# Patient Record
Sex: Male | Born: 1940 | Race: Black or African American | Hispanic: No | Smoking: Never smoker
Health system: Southern US, Community
[De-identification: ages and names within clinical notes are randomized; demographics above are authoritative.]

## PROBLEM LIST (undated history)

## (undated) DIAGNOSIS — I10 Essential (primary) hypertension: Secondary | ICD-10-CM

## (undated) DIAGNOSIS — E119 Type 2 diabetes mellitus without complications: Secondary | ICD-10-CM

## (undated) DIAGNOSIS — E7800 Pure hypercholesterolemia, unspecified: Secondary | ICD-10-CM

## (undated) HISTORY — PX: KNEE SURGERY: SHX244

---

## 2006-04-10 ENCOUNTER — Emergency Department (HOSPITAL_COMMUNITY): Admission: EM | Admit: 2006-04-10 | Discharge: 2006-04-10 | Payer: Self-pay | Admitting: Emergency Medicine

## 2009-01-06 ENCOUNTER — Emergency Department (HOSPITAL_COMMUNITY): Admission: EM | Admit: 2009-01-06 | Discharge: 2009-01-06 | Payer: Self-pay | Admitting: Emergency Medicine

## 2011-04-20 ENCOUNTER — Ambulatory Visit (INDEPENDENT_AMBULATORY_CARE_PROVIDER_SITE_OTHER): Payer: BC Managed Care – PPO

## 2011-04-20 ENCOUNTER — Inpatient Hospital Stay (INDEPENDENT_AMBULATORY_CARE_PROVIDER_SITE_OTHER)
Admission: RE | Admit: 2011-04-20 | Discharge: 2011-04-20 | Disposition: A | Discharge status: Home / Self Care | Payer: BC Managed Care – PPO | Source: Ambulatory Visit | Attending: Emergency Medicine | Admitting: Emergency Medicine

## 2011-04-20 ENCOUNTER — Emergency Department (HOSPITAL_COMMUNITY)
Admission: EM | Admit: 2011-04-20 | Discharge: 2011-04-21 | Disposition: A | Discharge status: Home / Self Care | Payer: Non-veteran care | Attending: Emergency Medicine | Admitting: Emergency Medicine

## 2011-04-20 DIAGNOSIS — I498 Other specified cardiac arrhythmias: Secondary | ICD-10-CM | POA: Insufficient documentation

## 2011-04-20 DIAGNOSIS — E785 Hyperlipidemia, unspecified: Secondary | ICD-10-CM | POA: Insufficient documentation

## 2011-04-20 DIAGNOSIS — R0989 Other specified symptoms and signs involving the circulatory and respiratory systems: Secondary | ICD-10-CM

## 2011-04-20 DIAGNOSIS — R0602 Shortness of breath: Secondary | ICD-10-CM | POA: Insufficient documentation

## 2011-04-20 DIAGNOSIS — E1169 Type 2 diabetes mellitus with other specified complication: Secondary | ICD-10-CM | POA: Insufficient documentation

## 2011-04-20 DIAGNOSIS — R0609 Other forms of dyspnea: Secondary | ICD-10-CM

## 2011-04-20 DIAGNOSIS — Z8679 Personal history of other diseases of the circulatory system: Secondary | ICD-10-CM | POA: Insufficient documentation

## 2011-04-20 DIAGNOSIS — J9801 Acute bronchospasm: Secondary | ICD-10-CM | POA: Insufficient documentation

## 2011-04-20 DIAGNOSIS — R05 Cough: Secondary | ICD-10-CM | POA: Insufficient documentation

## 2011-04-20 DIAGNOSIS — R059 Cough, unspecified: Secondary | ICD-10-CM | POA: Insufficient documentation

## 2011-04-20 DIAGNOSIS — I252 Old myocardial infarction: Secondary | ICD-10-CM | POA: Insufficient documentation

## 2011-04-20 DIAGNOSIS — I10 Essential (primary) hypertension: Secondary | ICD-10-CM | POA: Insufficient documentation

## 2011-04-20 LAB — DIFFERENTIAL
Basophils Absolute: 0 10*3/uL (ref 0.0–0.1)
Basophils Relative: 0 % (ref 0–1)
Eosinophils Relative: 1 % (ref 0–5)
Neutro Abs: 5.5 10*3/uL (ref 1.7–7.7)
Neutrophils Relative %: 61 % (ref 43–77)

## 2011-04-20 LAB — CBC
HCT: 44.1 % (ref 39.0–52.0)
Hemoglobin: 16.1 g/dL (ref 13.0–17.0)
MCHC: 36.5 g/dL — ABNORMAL HIGH (ref 30.0–36.0)

## 2011-04-20 LAB — BASIC METABOLIC PANEL
BUN: 14 mg/dL (ref 6–23)
CO2: 23 mEq/L (ref 19–32)
Calcium: 9.3 mg/dL (ref 8.4–10.5)
Creatinine, Ser: 1.52 mg/dL — ABNORMAL HIGH (ref 0.50–1.35)
Glucose, Bld: 137 mg/dL — ABNORMAL HIGH (ref 70–99)

## 2011-04-21 ENCOUNTER — Emergency Department (HOSPITAL_COMMUNITY): Payer: Non-veteran care

## 2011-04-21 LAB — CK TOTAL AND CKMB (NOT AT ARMC)
CK, MB: 2.7 ng/mL (ref 0.3–4.0)
CK, MB: 4.3 ng/mL — ABNORMAL HIGH (ref 0.3–4.0)
Relative Index: 0.8 (ref 0.0–2.5)
Relative Index: 1.3 (ref 0.0–2.5)
Total CK: 329 U/L — ABNORMAL HIGH (ref 7–232)

## 2011-04-21 LAB — TROPONIN I
Troponin I: <0.3 ng/mL (ref <0.30)
Troponin I: <0.3 ng/mL (ref <0.30)

## 2011-04-21 LAB — GLUCOSE, CAPILLARY: Glucose-Capillary: 367 mg/dL — ABNORMAL HIGH (ref 70–99)

## 2011-11-01 ENCOUNTER — Ambulatory Visit (HOSPITAL_COMMUNITY)
Admission: RE | Admit: 2011-11-01 | Discharge: 2011-11-01 | Disposition: A | Discharge status: Home / Self Care | Payer: Non-veteran care | Source: Ambulatory Visit | Attending: *Deleted | Admitting: *Deleted

## 2011-11-01 DIAGNOSIS — R9431 Abnormal electrocardiogram [ECG] [EKG]: Secondary | ICD-10-CM | POA: Insufficient documentation

## 2011-11-01 DIAGNOSIS — Z419 Encounter for procedure for purposes other than remedying health state, unspecified: Secondary | ICD-10-CM

## 2013-01-02 ENCOUNTER — Encounter (HOSPITAL_COMMUNITY): Payer: Self-pay | Admitting: Emergency Medicine

## 2013-01-02 ENCOUNTER — Inpatient Hospital Stay (HOSPITAL_COMMUNITY)
Admission: EM | Admit: 2013-01-02 | Discharge: 2013-01-04 | DRG: 638 | Disposition: A | Discharge status: Home / Self Care | Payer: Medicare Other | Attending: Internal Medicine | Admitting: Internal Medicine

## 2013-01-02 DIAGNOSIS — Z794 Long term (current) use of insulin: Secondary | ICD-10-CM

## 2013-01-02 DIAGNOSIS — E7800 Pure hypercholesterolemia, unspecified: Secondary | ICD-10-CM | POA: Diagnosis present

## 2013-01-02 DIAGNOSIS — I129 Hypertensive chronic kidney disease with stage 1 through stage 4 chronic kidney disease, or unspecified chronic kidney disease: Secondary | ICD-10-CM | POA: Diagnosis present

## 2013-01-02 DIAGNOSIS — N183 Chronic kidney disease, stage 3 unspecified: Secondary | ICD-10-CM | POA: Diagnosis present

## 2013-01-02 DIAGNOSIS — E785 Hyperlipidemia, unspecified: Secondary | ICD-10-CM | POA: Diagnosis present

## 2013-01-02 DIAGNOSIS — R739 Hyperglycemia, unspecified: Secondary | ICD-10-CM

## 2013-01-02 DIAGNOSIS — Z79899 Other long term (current) drug therapy: Secondary | ICD-10-CM

## 2013-01-02 DIAGNOSIS — Z7982 Long term (current) use of aspirin: Secondary | ICD-10-CM

## 2013-01-02 DIAGNOSIS — E1165 Type 2 diabetes mellitus with hyperglycemia: Secondary | ICD-10-CM | POA: Diagnosis present

## 2013-01-02 DIAGNOSIS — E871 Hypo-osmolality and hyponatremia: Secondary | ICD-10-CM | POA: Diagnosis present

## 2013-01-02 DIAGNOSIS — I10 Essential (primary) hypertension: Secondary | ICD-10-CM | POA: Diagnosis present

## 2013-01-02 DIAGNOSIS — N189 Chronic kidney disease, unspecified: Secondary | ICD-10-CM | POA: Diagnosis present

## 2013-01-02 DIAGNOSIS — IMO0001 Reserved for inherently not codable concepts without codable children: Principal | ICD-10-CM | POA: Diagnosis present

## 2013-01-02 DIAGNOSIS — E878 Other disorders of electrolyte and fluid balance, not elsewhere classified: Secondary | ICD-10-CM

## 2013-01-02 DIAGNOSIS — IMO0002 Reserved for concepts with insufficient information to code with codable children: Secondary | ICD-10-CM | POA: Diagnosis present

## 2013-01-02 DIAGNOSIS — N179 Acute kidney failure, unspecified: Secondary | ICD-10-CM

## 2013-01-02 HISTORY — DX: Essential (primary) hypertension: I10

## 2013-01-02 HISTORY — DX: Pure hypercholesterolemia, unspecified: E78.00

## 2013-01-02 HISTORY — DX: Type 2 diabetes mellitus without complications: E11.9

## 2013-01-02 LAB — COMPREHENSIVE METABOLIC PANEL
ALT: 25 U/L (ref 0–53)
Alkaline Phosphatase: 149 U/L — ABNORMAL HIGH (ref 39–117)
CO2: 25 mEq/L (ref 19–32)
Chloride: 85 mEq/L — ABNORMAL LOW (ref 96–112)
GFR calc Af Amer: 38 mL/min — ABNORMAL LOW (ref >90)
GFR calc non Af Amer: 33 mL/min — ABNORMAL LOW (ref >90)
Glucose, Bld: 692 mg/dL (ref 70–99)
Potassium: 4 mEq/L (ref 3.5–5.1)
Sodium: 125 mEq/L — ABNORMAL LOW (ref 135–145)
Total Bilirubin: 0.5 mg/dL (ref 0.3–1.2)

## 2013-01-02 LAB — URINALYSIS, ROUTINE W REFLEX MICROSCOPIC
Hgb urine dipstick: NEGATIVE
Ketones, ur: 15 mg/dL — AB
Leukocytes, UA: NEGATIVE
Protein, ur: NEGATIVE mg/dL
Urobilinogen, UA: 0.2 mg/dL (ref 0.0–1.0)

## 2013-01-02 LAB — URINE MICROSCOPIC-ADD ON

## 2013-01-02 LAB — CBC WITH DIFFERENTIAL/PLATELET
Basophils Absolute: 0 10*3/uL (ref 0.0–0.1)
Eosinophils Absolute: 0.1 10*3/uL (ref 0.0–0.7)
Lymphocytes Relative: 24 % (ref 12–46)
MCHC: 38.8 g/dL — ABNORMAL HIGH (ref 30.0–36.0)
Neutrophils Relative %: 68 % (ref 43–77)
RDW: 12.5 % (ref 11.5–15.5)

## 2013-01-02 MED ORDER — SODIUM CHLORIDE 0.9 % IV SOLN
1000.0000 mL | Freq: Once | INTRAVENOUS | Status: AC
  Administered 2013-01-03: 1000 mL via INTRAVENOUS

## 2013-01-02 MED ORDER — SODIUM CHLORIDE 0.9 % IV SOLN
INTRAVENOUS | Status: DC
  Administered 2013-01-03: 3.3 [IU]/h via INTRAVENOUS
  Filled 2013-01-02: qty 1

## 2013-01-02 MED ORDER — SODIUM CHLORIDE 0.9 % IV SOLN: 1000.0000 mL | INTRAVENOUS | Status: DC

## 2013-01-02 NOTE — ED Notes (Signed)
Attempted to start IV x2 w/o success. IV called.

## 2013-01-02 NOTE — ED Notes (Signed)
Jess from IV Team returned call and provided pt name, location and MRN for IV start

## 2013-01-02 NOTE — ED Notes (Addendum)
PT. REPORTS HYPERGLYCEMIA THIS EVENING CBG= 500 + AT HOME WITH GENERALIZED WEAKNESS / SLIGHT DIZZINESS . CBG AT TRIAGE = HIGH > 600 . PT. DID NOT TAKE HIS LANTUS INSULIN TODAY.

## 2013-01-02 NOTE — ED Notes (Signed)
Estelle Grumbles (girlfriend) [phone] (home) and [phone] (mobile) and pt verbalized that she can receive information about his condition.

## 2013-01-02 NOTE — ED Provider Notes (Signed)
History    CSN: [id number] Arrival date & time 01/02/13  2013 First MD Initiated Contact with Patient 01/02/13 2224    Chief Complaint  Patient presents with  . Hyperglycemia    HPI Patient presents to emergency room with hyperglycemia. The patient was recently hospitalized at the [facility] for hyperglycemia and dehydration. Patient states since he left the hospital he has had continued issues with elevated blood sugars. He has been monitoring his blood sugar and it has been elevated in the 3-400 range. Patient did not contact his doctor about this and finally his significant other was able to convince him to come in and get it checked. Denies any chest pain or shortness of breath. He has had issues with dizziness, generalized weakness, polyuria polydipsia.  Past Medical History  Diagnosis Date  . Diabetes mellitus without complication   . Hypertension   . Hypercholesterolemia     History reviewed. No pertinent past surgical history.  No family history on file.  History  Substance Use Topics  . Smoking status: Never Smoker   . Smokeless tobacco: Not on file  . Alcohol Use: No      Review of Systems  All other systems reviewed and are negative.    Allergies  Other  Home Medications   Current Outpatient Rx  Name  Route  Sig  Dispense  Refill  . amLODipine (NORVASC) 10 MG tablet   Oral   Take 10 mg by mouth daily.         Marland Kitchen aspirin 81 MG chewable tablet   Oral   Chew 81 mg by mouth daily.         Marland Kitchen atorvastatin (LIPITOR) 40 MG tablet   Oral   Take 20 mg by mouth daily.          Marland Kitchen glipiZIDE (GLUCOTROL) 10 MG tablet   Oral   Take 10 mg by mouth 2 (two) times daily before a meal.         . hydrochlorothiazide (HYDRODIURIL) 25 MG tablet   Oral   Take 25 mg by mouth daily.         . insulin glargine (LANTUS) 100 UNIT/ML injection   Subcutaneous   Inject 15 Units into the skin at bedtime.         . metoprolol tartrate (LOPRESSOR) 25 MG tablet  Oral   Take 25 mg by mouth 2 (two) times daily.         . Multiple Vitamin (MULTIVITAMIN WITH MINERALS) TABS   Oral   Take 1 tablet by mouth daily.           BP 152/81  Pulse 104  Temp(Src) 97.9 F (36.6 C) (Oral)  Resp 20  SpO2 98%  Physical Exam  Nursing note and vitals reviewed. Constitutional: He appears well-developed and well-nourished. No distress.  HENT:  Head: Normocephalic and atraumatic.  Right Ear: External ear normal.  Left Ear: External ear normal.  Eyes: Conjunctivae are normal. Right eye exhibits no discharge. Left eye exhibits no discharge. No scleral icterus.  Neck: Neck supple. No tracheal deviation present.  Cardiovascular: Normal rate, regular rhythm and intact distal pulses.   Pulmonary/Chest: Effort normal and breath sounds normal. No stridor. No respiratory distress. He has no wheezes. He has no rales.  Abdominal: Soft. Bowel sounds are normal. He exhibits no distension. There is no tenderness. There is no rebound and no guarding.  Musculoskeletal: He exhibits no edema and no tenderness.  Neurological: He is alert.  He has normal strength. No sensory deficit. Cranial nerve deficit:  no gross defecits noted. He exhibits normal muscle tone. He displays no seizure activity. Coordination normal.  Skin: Skin is warm and dry. No rash noted.  Psychiatric: He has a normal mood and affect.    ED Course  Procedures (including critical care time)  Medications  0.9 %  sodium chloride infusion (not administered)    Followed by  0.9 %  sodium chloride infusion (not administered)    Followed by  0.9 %  sodium chloride infusion (not administered)  insulin regular (NOVOLIN R,HUMULIN R) 1 Units/mL in sodium chloride 0.9 % 100 mL infusion (not administered)    Labs Reviewed  GLUCOSE, CAPILLARY - Abnormal; Notable for the following:    Glucose-Capillary >600 (*)    All other components within normal limits  COMPREHENSIVE METABOLIC PANEL - Abnormal; Notable for  the following:    Sodium 125 (*)    Chloride 85 (*)    Glucose, Bld 692 (*)    BUN 32 (*)    Creatinine, Ser 1.94 (*)    Alkaline Phosphatase 149 (*)    GFR calc non Af Amer 33 (*)    GFR calc Af Amer 38 (*)    All other components within normal limits  CBC WITH DIFFERENTIAL - Abnormal; Notable for the following:    MCHC 38.8 (*)    All other components within normal limits  URINALYSIS, ROUTINE W REFLEX MICROSCOPIC   No results found.   1. Hyperglycemia   2. Acute on chronic renal failure   3. Hyponatremia   4. Hypochloremia       MDM  The patient has hyperglycemia without signs of ketoacidosis. I reviewed his outpatient blood sugar records. He's had very poor control for at least the last week or 2. Which is significantly elevated blood sugars here, worsening renal insufficiency, I will consult the medical service regarding admission for further management and stabilization of his diabetes        Celene Kras, MD 01/02/13 [phone]

## 2013-01-03 ENCOUNTER — Encounter (HOSPITAL_COMMUNITY): Payer: Self-pay | Admitting: Internal Medicine

## 2013-01-03 DIAGNOSIS — N189 Chronic kidney disease, unspecified: Secondary | ICD-10-CM | POA: Diagnosis present

## 2013-01-03 DIAGNOSIS — E871 Hypo-osmolality and hyponatremia: Secondary | ICD-10-CM | POA: Diagnosis present

## 2013-01-03 DIAGNOSIS — I10 Essential (primary) hypertension: Secondary | ICD-10-CM | POA: Diagnosis present

## 2013-01-03 DIAGNOSIS — N179 Acute kidney failure, unspecified: Secondary | ICD-10-CM

## 2013-01-03 DIAGNOSIS — E1165 Type 2 diabetes mellitus with hyperglycemia: Secondary | ICD-10-CM | POA: Diagnosis present

## 2013-01-03 DIAGNOSIS — R7309 Other abnormal glucose: Secondary | ICD-10-CM

## 2013-01-03 DIAGNOSIS — E785 Hyperlipidemia, unspecified: Secondary | ICD-10-CM | POA: Diagnosis present

## 2013-01-03 LAB — CBC WITH DIFFERENTIAL/PLATELET
Basophils Relative: 0 % (ref 0–1)
Eosinophils Absolute: 0.1 10*3/uL (ref 0.0–0.7)
MCH: 31.2 pg (ref 26.0–34.0)
MCHC: 37.8 g/dL — ABNORMAL HIGH (ref 30.0–36.0)
Monocytes Relative: 8 % (ref 3–12)
Neutrophils Relative %: 49 % (ref 43–77)
Platelets: 248 10*3/uL (ref 150–400)
RDW: 12.6 % (ref 11.5–15.5)

## 2013-01-03 LAB — COMPREHENSIVE METABOLIC PANEL
ALT: 21 U/L (ref 0–53)
AST: 18 U/L (ref 0–37)
CO2: 26 mEq/L (ref 19–32)
Chloride: 101 mEq/L (ref 96–112)
GFR calc non Af Amer: 46 mL/min — ABNORMAL LOW (ref >90)
Sodium: 137 mEq/L (ref 135–145)
Total Bilirubin: 0.4 mg/dL (ref 0.3–1.2)

## 2013-01-03 LAB — GLUCOSE, CAPILLARY
Glucose-Capillary: 394 mg/dL — ABNORMAL HIGH (ref 70–99)
Glucose-Capillary: 275 mg/dL — ABNORMAL HIGH (ref 70–99)
Glucose-Capillary: 331 mg/dL — ABNORMAL HIGH (ref 70–99)
Glucose-Capillary: 154 mg/dL — ABNORMAL HIGH (ref 70–99)

## 2013-01-03 MED ORDER — ACETAMINOPHEN 650 MG RE SUPP: 650.0000 mg | Freq: Four times a day (QID) | RECTAL | Status: DC | PRN

## 2013-01-03 MED ORDER — ONDANSETRON HCL 4 MG/2ML IJ SOLN: 4.0000 mg | Freq: Four times a day (QID) | INTRAMUSCULAR | Status: DC | PRN

## 2013-01-03 MED ORDER — INSULIN ASPART 100 UNIT/ML ~~LOC~~ SOLN
0.0000 [IU] | Freq: Every day | SUBCUTANEOUS | Status: DC
Start: 2013-01-03 — End: 2013-01-04

## 2013-01-03 MED ORDER — ATORVASTATIN CALCIUM 20 MG PO TABS
20.0000 mg | ORAL_TABLET | Freq: Every day | ORAL | Status: DC
  Administered 2013-01-03 – 2013-01-04 (×2): 20 mg via ORAL
  Filled 2013-01-03 (×2): qty 1

## 2013-01-03 MED ORDER — ENOXAPARIN SODIUM 40 MG/0.4ML ~~LOC~~ SOLN
40.0000 mg | Freq: Every day | SUBCUTANEOUS | Status: DC
  Administered 2013-01-03 – 2013-01-04 (×2): 40 mg via SUBCUTANEOUS
  Filled 2013-01-03 (×2): qty 0.4

## 2013-01-03 MED ORDER — SODIUM CHLORIDE 0.9 % IV SOLN: INTRAVENOUS | Status: DC

## 2013-01-03 MED ORDER — AMLODIPINE BESYLATE 10 MG PO TABS
10.0000 mg | ORAL_TABLET | Freq: Every day | ORAL | Status: DC
  Administered 2013-01-03 – 2013-01-04 (×2): 10 mg via ORAL
  Filled 2013-01-03 (×2): qty 1

## 2013-01-03 MED ORDER — POTASSIUM CHLORIDE 10 MEQ/100ML IV SOLN: 10.0000 meq | INTRAVENOUS | Status: DC

## 2013-01-03 MED ORDER — ASPIRIN 81 MG PO CHEW
81.0000 mg | CHEWABLE_TABLET | Freq: Every day | ORAL | Status: DC
  Administered 2013-01-03 – 2013-01-04 (×2): 81 mg via ORAL
  Filled 2013-01-03 (×2): qty 1

## 2013-01-03 MED ORDER — METOPROLOL TARTRATE 25 MG PO TABS: 25.0000 mg | ORAL_TABLET | Freq: Two times a day (BID) | ORAL | Status: DC

## 2013-01-03 MED ORDER — INSULIN GLARGINE 100 UNIT/ML ~~LOC~~ SOLN
15.0000 [IU] | Freq: Every day | SUBCUTANEOUS | Status: DC
  Filled 2013-01-03: qty 0.15

## 2013-01-03 MED ORDER — ADULT MULTIVITAMIN W/MINERALS CH
1.0000 | ORAL_TABLET | Freq: Every day | ORAL | Status: DC
  Administered 2013-01-03 – 2013-01-04 (×2): 1 via ORAL
  Filled 2013-01-03 (×2): qty 1

## 2013-01-03 MED ORDER — DEXTROSE-NACL 5-0.45 % IV SOLN: INTRAVENOUS | Status: DC

## 2013-01-03 MED ORDER — SODIUM CHLORIDE 0.9 % IV SOLN
INTRAVENOUS | Status: DC
  Administered 2013-01-03: 02:00:00 via INTRAVENOUS

## 2013-01-03 MED ORDER — ENOXAPARIN SODIUM 40 MG/0.4ML ~~LOC~~ SOLN: 40.0000 mg | SUBCUTANEOUS | Status: DC

## 2013-01-03 MED ORDER — GLIPIZIDE 10 MG PO TABS: 10.0000 mg | ORAL_TABLET | Freq: Two times a day (BID) | ORAL | Status: DC

## 2013-01-03 MED ORDER — SODIUM CHLORIDE 0.9 % IJ SOLN
3.0000 mL | Freq: Two times a day (BID) | INTRAMUSCULAR | Status: DC
  Administered 2013-01-04: 3 mL via INTRAVENOUS

## 2013-01-03 MED ORDER — AMLODIPINE BESYLATE 10 MG PO TABS: 10.0000 mg | ORAL_TABLET | Freq: Every day | ORAL | Status: DC

## 2013-01-03 MED ORDER — ACETAMINOPHEN 325 MG PO TABS: 650.0000 mg | ORAL_TABLET | Freq: Four times a day (QID) | ORAL | Status: DC | PRN

## 2013-01-03 MED ORDER — METOPROLOL TARTRATE 25 MG PO TABS
25.0000 mg | ORAL_TABLET | Freq: Two times a day (BID) | ORAL | Status: DC
  Administered 2013-01-03 – 2013-01-04 (×3): 25 mg via ORAL
  Filled 2013-01-03 (×4): qty 1

## 2013-01-03 MED ORDER — DEXTROSE 50 % IV SOLN: 25.0000 mL | INTRAVENOUS | Status: DC | PRN

## 2013-01-03 MED ORDER — INSULIN REGULAR BOLUS VIA INFUSION
0.0000 [IU] | Freq: Three times a day (TID) | INTRAVENOUS | Status: DC
  Filled 2013-01-03: qty 10

## 2013-01-03 MED ORDER — ATORVASTATIN CALCIUM 20 MG PO TABS: 20.0000 mg | ORAL_TABLET | Freq: Every day | ORAL | Status: DC

## 2013-01-03 MED ORDER — INSULIN GLARGINE 100 UNIT/ML ~~LOC~~ SOLN
30.0000 [IU] | Freq: Every day | SUBCUTANEOUS | Status: DC
  Administered 2013-01-03: 30 [IU] via SUBCUTANEOUS
  Filled 2013-01-03 (×2): qty 0.3

## 2013-01-03 MED ORDER — ONDANSETRON HCL 4 MG PO TABS: 4.0000 mg | ORAL_TABLET | Freq: Four times a day (QID) | ORAL | Status: DC | PRN

## 2013-01-03 MED ORDER — SODIUM CHLORIDE 0.9 % IV SOLN
INTRAVENOUS | Status: DC
Start: 2013-01-03 — End: 2013-01-03

## 2013-01-03 MED ORDER — ASPIRIN 81 MG PO CHEW: 81.0000 mg | CHEWABLE_TABLET | Freq: Every day | ORAL | Status: DC

## 2013-01-03 MED ORDER — SODIUM CHLORIDE 0.9 % IV SOLN
INTRAVENOUS | Status: AC
  Administered 2013-01-03 (×3): via INTRAVENOUS

## 2013-01-03 MED ORDER — GLIPIZIDE 10 MG PO TABS
10.0000 mg | ORAL_TABLET | Freq: Two times a day (BID) | ORAL | Status: DC
  Administered 2013-01-03: 10 mg via ORAL
  Filled 2013-01-03 (×4): qty 1

## 2013-01-03 MED ORDER — INSULIN ASPART 100 UNIT/ML ~~LOC~~ SOLN
0.0000 [IU] | Freq: Three times a day (TID) | SUBCUTANEOUS | Status: DC
  Administered 2013-01-03 (×2): 8 [IU] via SUBCUTANEOUS
  Administered 2013-01-03: 11 [IU] via SUBCUTANEOUS
  Administered 2013-01-04: 3 [IU] via SUBCUTANEOUS

## 2013-01-03 MED ORDER — SODIUM CHLORIDE 0.9 % IV SOLN
INTRAVENOUS | Status: DC
  Filled 2013-01-03: qty 1

## 2013-01-03 NOTE — Progress Notes (Signed)
[facility] PROGRESS NOTE  Alex Franco ZOX:[id number] DOB: 1941/09/14 DOA: 01/02/2013 PCP: Provider Not In System  Assessment/Plan  Uncontrolled diabetes with hyperglycemia -  Appreciate diabetes educator assistance -  Increase lantus to 30 units -  Continue moderate dose SSI -  F/u AM fingersticks  Hyponatremia probably from hyperglycemia and dehydration  - continue with fluid hydration   Chronic kidney disease with mild worsening of creatinine.  Creatinine back to baseline -  Decrease IVF -  Continue to hold HCTZ  Hyperlipidemia - continue statins.  Elevated blood pressures -  Consider starting ACEI as alternative to HCTZ  Diet:  diabetic Access:  PIV IVF:  NS at 59ml/h Proph:  lovenox  Code Status: full code Family Communication: spoke with patient alone Disposition Plan: likely to home tomorrow    Consultants:  Diabetes educator  Procedures:  none  Antibiotics:  none   HPI/Subjective:  Denies acute complaints.  States that he feels well.     Objective: Filed Vitals:   01/03/13 0447 01/03/13 1007 01/03/13 1400 01/03/13 1714  BP: 138/66 163/83 126/68 134/72  Pulse: 74 82 74 73  Temp: 97.9 F (36.6 C) 97.4 F (36.3 C) 97.6 F (36.4 C) 97.3 F (36.3 C)  TempSrc: Oral Oral Oral Oral  Resp: 16 17 17 17   Height:      Weight:      SpO2: 99% 100% 100% 0%    Intake/Output Summary (Last 24 hours) at 01/03/13 1854 Last data filed at 01/03/13 1811  Gross per 24 hour  Intake 4607.5 ml  Output   1875 ml  Net 2732.5 ml   Filed Weights   01/03/13 0219 01/03/13 0333  Weight: 98.884 kg (218 lb) 90.175 kg (198 lb 12.8 oz)    Exam:   General:  AAM, No acute distress  HEENT:  NCAT, MMM  Cardiovascular:  RRR, nl S1, S2 no mrg, 2+ pulses, warm extremities  Respiratory:  CTAB, no increased WOB  Abdomen:   NABS, soft, NT/ND  MSK:    Normal tone and bulk, no LEE  Neuro:  Grossly intact  Data Reviewed: Basic Metabolic Panel:  Recent  Labs Lab 01/02/13 2024 01/03/13 0500  NA 125* 137  K 4.0 3.5  CL 85* 101  CO2 25 26  GLUCOSE 692* 284*  BUN 32* 26*  CREATININE 1.94* 1.46*  CALCIUM 9.9 9.0   Liver Function Tests:  Recent Labs Lab 01/02/13 2024 01/03/13 0500  AST 20 18  ALT 25 21  ALKPHOS 149* 122*  BILITOT 0.5 0.4  PROT 7.8 6.8  ALBUMIN 4.1 3.5   No results found for this basename: LIPASE, AMYLASE,  in the last 168 hours No results found for this basename: AMMONIA,  in the last 168 hours CBC:  Recent Labs Lab 01/02/13 2024 01/03/13 0500  WBC 8.9 7.0  NEUTROABS 6.1 3.4  HGB 16.4 14.7  HCT 42.3 38.9*  MCV 82.8 82.6  PLT 259 248   Cardiac Enzymes: No results found for this basename: CKTOTAL, CKMB, CKMBINDEX, TROPONINI,  in the last 168 hours BNP (last 3 results) No results found for this basename: PROBNP,  in the last 8760 hours CBG:  Recent Labs Lab 01/03/13 0131 01/03/13 0336 01/03/13 0726 01/03/13 1131 01/03/13 1630  GLUCAP 394* 344* 275* 331* 269*    No results found for this or any previous visit (from the past 240 hour(s)).   Studies: No results found.  Scheduled Meds: . amLODipine  10 mg Oral Daily  . aspirin  81 mg Oral Daily  . atorvastatin  20 mg Oral Daily  . enoxaparin (LOVENOX) injection  40 mg Subcutaneous Daily  . insulin aspart  0-15 Units Subcutaneous TID WC  . insulin aspart  0-5 Units Subcutaneous QHS  . insulin glargine  30 Units Subcutaneous QHS  . metoprolol tartrate  25 mg Oral BID  . multivitamin with minerals  1 tablet Oral Daily  . sodium chloride  3 mL Intravenous Q12H   Continuous Infusions: . sodium chloride 125 mL/hr at 01/03/13 1755    Principal Problem:   Diabetes mellitus type 2, uncontrolled Active Problems:   Hyponatremia   CKD (chronic kidney disease)   Hyperlipidemia   Hypertension    Time spent: 30 min    Nyelli Samara, [facility]  [facility] Pager [phone]. If 7PM-7AM, please contact night-coverage at [email or web address],  password [facility] 01/03/2013, 6:54 PM  LOS: 1 day

## 2013-01-03 NOTE — Progress Notes (Signed)
CRITICAL VALUE ALERT  Critical value received:  Hgb A1C 14.2  Date of notification:  01/03/13  Time of notification:  1158    Critical value read back:yes  Nurse who received alert:  Karoline Caldwell, RN  MD notified (1st page):  Dr Malachi Bonds    Time of first page:  1206  MD notified (2nd page):   Time of second page:  Responding MD:  Dr. Malachi Bonds  Time MD responded:  1207

## 2013-01-03 NOTE — H&P (Signed)
[facility] History and Physical  Alex Franco MWU:[id number] DOB: Sep 11, 1941 DOA: 01/02/2013  Referring physician: Patsy Lager. PCP: Provider Not In System the [facility]. Specialists: None.  Chief Complaint: Hyperglycemia.  HPI: Alex Franco is a 72 y.o. male with history of diabetes mellitus type 2, hypertension, chronic kidney disease, hyperlipidemia presented to the ER because of increased blood sugar. Patient states that he has been checking his blood sugar at home and was running high and today it measured around 500. In the ER it was confirmed to be more than 600. Patient was started on IV insulin infusion and has been admitted for further management. Patient otherwise denies any chest pain shortness of breath nausea vomiting or abnormal pain. Denies any dysuria discharges or diarrhea. Patient states that 2 weeks ago he was in the ER at the [place] and was observed over there for hyperglycemia. Patient states he has been taking his medications as advised.  Review of Systems: As presented in the history of presenting illness, rest negative.  Past Medical History  Diagnosis Date  . Diabetes mellitus without complication   . Hypertension   . Hypercholesterolemia    Past Surgical History  Procedure Laterality Date  . Knee surgery     Social History:  reports that he has never smoked. He does not have any smokeless tobacco history on file. He reports that he does not drink alcohol or use illicit drugs. Lives at home. where does patient live-- Can do ADLs. Can patient participate in ADLs?  Allergies  Allergen Reactions  . Other Anaphylaxis    To Walnuts     History reviewed. No pertinent family history.    Prior to Admission medications   Medication Sig Start Date End Date Taking? Authorizing Provider  amLODipine (NORVASC) 10 MG tablet Take 10 mg by mouth daily.   Yes Historical Provider, MD  aspirin 81 MG chewable tablet Chew 81 mg by mouth daily.   Yes Historical  Provider, MD  atorvastatin (LIPITOR) 40 MG tablet Take 20 mg by mouth daily.    Yes Historical Provider, MD  glipiZIDE (GLUCOTROL) 10 MG tablet Take 10 mg by mouth 2 (two) times daily before a meal.   Yes Historical Provider, MD  hydrochlorothiazide (HYDRODIURIL) 25 MG tablet Take 25 mg by mouth daily.   Yes Historical Provider, MD  insulin glargine (LANTUS) 100 UNIT/ML injection Inject 15 Units into the skin at bedtime.   Yes Historical Provider, MD  metoprolol tartrate (LOPRESSOR) 25 MG tablet Take 25 mg by mouth 2 (two) times daily.   Yes Historical Provider, MD  Multiple Vitamin (MULTIVITAMIN WITH MINERALS) TABS Take 1 tablet by mouth daily.   Yes Historical Provider, MD   Physical Exam: Filed Vitals:   01/02/13 2017 01/02/13 2300 01/02/13 2315  BP: 152/81 151/79 146/77  Pulse: 104 85 90  Temp: 97.9 F (36.6 C)    TempSrc: Oral    Resp: 20  16  SpO2: 98% 98% 98%     General:  Well-developed well-nourished.  Eyes: Anicteric no pallor.  ENT: No discharge from the ears eyes nose or mouth.  Neck: No mass felt.  Cardiovascular: S1-S2 heard.  Respiratory: No rhonchi or crepitations.  Abdomen: Soft nontender bowel sounds present.  Skin: No rash.  Musculoskeletal: No edema.  Psychiatric: Appears normal.  Neurologic: Alert awake oriented to time place and person. Moves all extremities.  Labs on Admission:  Basic Metabolic Panel:  Recent Labs Lab 01/02/13 2024  NA 125*  K  4.0  CL 85*  CO2 25  GLUCOSE 692*  BUN 32*  CREATININE 1.94*  CALCIUM 9.9   Liver Function Tests:  Recent Labs Lab 01/02/13 2024  AST 20  ALT 25  ALKPHOS 149*  BILITOT 0.5  PROT 7.8  ALBUMIN 4.1   No results found for this basename: LIPASE, AMYLASE,  in the last 168 hours No results found for this basename: AMMONIA,  in the last 168 hours CBC:  Recent Labs Lab 01/02/13 2024  WBC 8.9  NEUTROABS 6.1  HGB 16.4  HCT 42.3  MCV 82.8  PLT 259   Cardiac Enzymes: No results found  for this basename: CKTOTAL, CKMB, CKMBINDEX, TROPONINI,  in the last 168 hours  BNP (last 3 results) No results found for this basename: PROBNP,  in the last 8760 hours CBG:  Recent Labs Lab 01/02/13 2020  GLUCAP >600*    Radiological Exams on Admission: No results found.   Assessment/Plan Principal Problem:   Diabetes mellitus type 2, uncontrolled Active Problems:   Hyponatremia   CKD (chronic kidney disease)   Hyperlipidemia   Hypertension   1. Uncontrolled diabetes mellitus type 2 - continue with IV insulin infusion until blood sugar decreases to less than 250. At that point change to subcutaneous insulin. Check hemoglobin A1c. Patient's anion gap is mildly elevated but I think patient is not in DKA. Check acetone levels. 2. Hyponatremia probably from hyperglycemia and dehydration - continue with fluid hydration and insulin IV and closely follow metabolic panel. 3. Chronic kidney disease with mild worsening of creatinine when compared to the last - closely follow intake output and metabolic panel. At this time I'm discontinuing HCTZ. 4. Hypertension - continue home medications. Due to dehydration will discontinue the HCTZ for now. 5. Hyperlipidemia - continue statins.    Code Status: Full code.  Family Communication: None.  Disposition Plan: Admit to inpatient.    [name] N. [facility] Pager [phone].  If 7PM-7AM, please contact night-coverage [email or web address] Password [facility] 01/03/2013, 12:42 AM

## 2013-01-03 NOTE — Progress Notes (Signed)
Inpatient Diabetes Program Recommendations  AACE/ADA: New Consensus Statement on Inpatient Glycemic Control (2013)  Target Ranges:  Prepandial:   less than 140 mg/dL      Peak postprandial:   less than 180 mg/dL (1-2 hours)      Critically ill patients:  140 - 180 mg/dL     Results for Alex Franco, Alex Franco (MRN [id number]) as of 01/03/2013 16:44  Ref. Range 01/03/2013 01:31 01/03/2013 03:36 01/03/2013 07:26  Glucose-Capillary Latest Range: 70-99 mg/dL 469 (H) 629 (H) 528 (H)    Results for Alex Franco, Alex Franco (MRN [id number]) as of 01/03/2013 16:44  Ref. Range 01/03/2013 00:04  Hemoglobin A1C Latest Range: <5.7 % 14.2 (H)    Patient admitted with glucose of 692 mg/dl.  2nd ED visit for hyperglycemia in 2 weeks.  Per patient, he was seen at the [facility] approximately 1 week ago and they sent him home on the same medications he was on before.  Back to [facility] ED with hyperglycemia and symptoms of high glucose (increased thirst, increased urination, fatigue).  Reviewed patient's home CBG log book with him.  Checks his CBGs once daily (usually in the morning before breakfast).  Per review, morning CBGs all >200 mg/dl (some 272 mg/dl +).  Patient also occasionally checks postprandial CBGs as well.  These were all >250 mg/dl as well.    Patient stated that he has no signs/symptoms of infection, fever, etc.  Per patient, [facility] his girlfriend today to tell him to increase his Lantus dose to 30 units QHS.  Concerned that Lantus will not be enough insulin for him and that he may need to have Novolog insulin tid at home to cover his food intake.  Could start with a simple SSI regimen for him for home and have him follow up with his PCP at the [facility].  Patient has follow-up appt with [facility] on April 16th next week.    Spoke with patient about the possibility of adding Novolog at mealtimes.  Patient agreeable and said he would do what he needs to do to get his CBGs under control.  Discussed s/sxs of hypoglycemia and proper  treatment at home.  Discussed what a SSI regimen is and how it works.  If MD decides to send patient home on a SSI regimen, will ask RN to review how to use a SSI with patient as well.  Called Dr. Malachi Bonds to discuss.  Also discussed with RN as well.  MD- If you send patient home on Novolog as well, please make sure to give Rx for Novolog vial.    Please also give patient a copy of our SSI regimen from hospital or provide with custom scale of your choosing.  Thank you! Ambrose Finland RN, MSN, CDE Diabetes Coordinator Inpatient Diabetes Program [phone]

## 2013-01-03 NOTE — ED Notes (Signed)
Repeat CBG 394 at present. Dr. Toniann Fail updated re: CBG prior to starting ordered insulin drip. To proceed with insulin drip.

## 2013-01-04 MED ORDER — LISINOPRIL 5 MG PO TABS: 5.0000 mg | ORAL_TABLET | Freq: Every day | ORAL | Status: DC

## 2013-01-04 MED ORDER — INSULIN ASPART 100 UNIT/ML ~~LOC~~ SOLN: 0.0000 [IU] | Freq: Three times a day (TID) | SUBCUTANEOUS | Status: DC

## 2013-01-04 MED ORDER — INSULIN GLARGINE 100 UNIT/ML ~~LOC~~ SOLN: 30.0000 [IU] | Freq: Every day | SUBCUTANEOUS | Status: DC

## 2013-01-04 NOTE — Care Management Note (Signed)
   CARE MANAGEMENT NOTE 01/04/2013  Patient:  Alex Franco,Alex Franco   Account Number:  [id number]  Date Initiated:  01/04/2013  Documentation initiated by:  Johny Shock  Subjective/Objective Assessment:   Request for assistance with medications.     Action/Plan:   Per pt he obtains meds from [place] in [place] and has an appointment for Tuesday, January 08, 2013. Pt has Lantus Insulin at home and a new prescription of Novolog. Pt has [employer] with medication benefits.   Anticipated DC Date:  01/04/2013   Anticipated DC Plan:  HOME/SELF CARE         Choice offered to / List presented to:             Status of service:  Completed, signed off Medicare Important Message given?   (If response is "NO", the following Medicare IM given date fields will be blank) Date Medicare IM given:   Date Additional Medicare IM given:    Discharge Disposition:  HOME/SELF CARE  Per UR Regulation:    If discussed at Long Length of Stay Meetings, dates discussed:    Comments:  01/04/2013 Pt states that he drives himself to the [place] for appointments and can drive there for medications. This CM discussed with pt that his BCBS could also cover this medication with copay. Unclear if pt will actually pursue either plan. CM is unable to assist as this pt has insurance coverage. Pt initially stated that he had no ride home and planned to walk. This case manager spoke with pt and googled distance to pt home to be 6.7 miles, pt laughingly stated that he had actually called a friend who would be coming to pick him up. Johny Shock RN MPH Case Manager [phone]

## 2013-01-04 NOTE — Discharge Summary (Signed)
Physician Discharge Summary  Dmani Mizer ZOX:[id number] DOB: Jan 18, 1941 DOA: 01/02/2013  PCP: Provider Not In System  Admit date: 01/02/2013 Discharge date: 01/04/2013  Recommendations for Outpatient Follow-up:  1. Follow up with primary care doctor to review fingersticks in 1 weeks.  Follow up blood pressure, fingersticks.  Repeat BMP to check creatinine and potassium after starting ACEI.    Discharge Diagnoses:  Principal Problem:   Diabetes mellitus type 2, uncontrolled Active Problems:   Hyponatremia   CKD (chronic kidney disease)   Hyperlipidemia   Hypertension   Discharge Condition: stable, improved  Diet recommendation: diabetic   Wt Readings from Last 3 Encounters:  01/03/13 92.5 kg (203 lb 14.8 oz)    History of present illness:   Alex Franco is a 72 y.o. male with history of diabetes mellitus type 2, hypertension, chronic kidney disease, hyperlipidemia presented to the ER because of increased blood sugar. Patient states that he has been checking his blood sugar at home and was running high and today it measured around 500. In the ER it was confirmed to be more than 600. Patient was started on IV insulin infusion and has been admitted for further management. Patient otherwise denies any chest pain shortness of breath nausea vomiting or abnormal pain. Denies any dysuria discharges or diarrhea. Patient states that 2 weeks ago he was in the ER at the [place] and was observed over there for hyperglycemia. Patient states he has been taking his medications as advised.  Hospital Course:   Uncontrolled diabetes with hyperglycemia.  Mr. Silveria was admitted and briefly started on an insulin gtt which was quickly transitioned to subcutaneous insulin.  He was not in DKA at the time of admission, however, he was dehydrated and was given IVF.  His fingersticks trended down and his lantus was increased to 30 units and Novolog SSI was added for meal time coverage.  His glipizide was discontinued.  He should  continue to trend his fingersticks in his log book and take the book with him to his follow up appointment with his primary care doctor in 1-2 weeks.    Hyponatremia was due to a combination of artificial lowering from hyperglycemia and from dehydration from osmotic diuresis, resolved over 24 hours with IV fluids.    Chronic kidney disease with mild worsening of creatinine. Creatinine back to baseline after 24 hours of hydration.  His HCTZ was discontinued.  Because of his stage 3 kidney disease in the setting of diabetes, he was started on ACEI and should follow up with his primary care doctor for repeat BMP in one week.   Hyperlipidemia - continued statin.   Elevated blood pressures.  Transitioned HCTZ to ACEI given on going diabetes.   He continued norvasc and metoprolol  Consultants:  Diabetes educator Procedures:  none Antibiotics:  none   Discharge Exam: Filed Vitals:   01/04/13 0559  BP: 136/75  Pulse: 71  Temp: 97.6 F (36.4 C)  Resp: 18   Filed Vitals:   01/03/13 1400 01/03/13 1714 01/03/13 2246 01/04/13 0559  BP: 126/68 134/72 151/82 136/75  Pulse: 74 73 74 71  Temp: 97.6 F (36.4 C) 97.3 F (36.3 C) 98.2 F (36.8 C) 97.6 F (36.4 C)  TempSrc: Oral Oral Oral Oral  Resp: 17 17 19 18   Height:      Weight:   92.5 kg (203 lb 14.8 oz)   SpO2: 100% 0% 100% 100%   Patient states that he feels well.   General: AAM, No acute  distress, sitting at edge of bed HEENT: NCAT, MMM  Cardiovascular: RRR, nl S1, S2 no mrg, 2+ pulses, warm extremities  Respiratory: CTAB, no increased WOB  Abdomen: NABS, soft, NT/ND  MSK: Normal tone and bulk, no LEE  Neuro: Grossly intact   Discharge Instructions      Discharge Orders   Future Orders Complete By Expires     Ambulatory referral to Nutrition and Diabetic Education  As directed     Comments:      A1c 14.2% (01/03/13).  Was taking Lantus 15 units QHS + Glipizide 10mg  bid.  Has elevated fasting glucose levels since  February.  PCP at [facility] in [place].  Patient likely to be d/c'd home on Novolog and Lantus.  Patient: Please call the [facility] after discharge to schedule an appointment for diabetes education if you do not hear from the center before discharge  [phone]    Call MD for:  difficulty breathing, headache or visual disturbances  As directed     Call MD for:  extreme fatigue  As directed     Call MD for:  hives  As directed     Call MD for:  persistant dizziness or light-headedness  As directed     Call MD for:  persistant nausea and vomiting  As directed     Call MD for:  severe uncontrolled pain  As directed     Call MD for:  temperature >100.4  As directed     Diet - low sodium heart healthy  As directed     Diet Carb Modified  As directed     Discharge instructions  As directed     Comments:      You were hospitalized with high fingersticks.  Your glipizide was discontinued and you were started on lantus 30 units and sliding scale insulin.  Your fingersticks have trended down.  You were dehydrated so your diuretic blood pressure medication HCTZ was stopped and you were started on lisinopril instead.  Lisinopril is a good medication for blood pressure in the setting of diabetes, but it can effect your potassium and kidneys so please have your primary care doctor repeat your blood work in 1 week.  Please call your primary care doctor or the diabetes educator if your fingersticks go over 400.    Increase activity slowly  As directed         Medication List    STOP taking these medications       glipiZIDE 10 MG tablet  Commonly known as:  GLUCOTROL     hydrochlorothiazide 25 MG tablet  Commonly known as:  HYDRODIURIL      TAKE these medications       amLODipine 10 MG tablet  Commonly known as:  NORVASC  Take 10 mg by mouth daily.     aspirin 81 MG chewable tablet  Chew 81 mg by mouth daily.     atorvastatin 40 MG tablet  Commonly known  as:  LIPITOR  Take 20 mg by mouth daily.     insulin aspart 100 UNIT/ML injection  Commonly known as:  novoLOG  Inject 0-15 Units into the skin 3 (three) times daily with meals.     insulin glargine 100 UNIT/ML injection  Commonly known as:  LANTUS  Inject 0.3 mLs (30 Units total) into the skin at bedtime.     lisinopril 5 MG tablet  Commonly known as:  PRINIVIL,ZESTRIL  Take 1 tablet (5  mg total) by mouth daily.     metoprolol tartrate 25 MG tablet  Commonly known as:  LOPRESSOR  Take 25 mg by mouth 2 (two) times daily.     multivitamin with minerals Tabs  Take 1 tablet by mouth daily.       Follow-up Information   Follow up with Provider Not In System. Schedule an appointment as soon as possible for a visit in 1 week.       The results of significant diagnostics from this hospitalization (including imaging, microbiology, ancillary and laboratory) are listed below for reference.    Significant Diagnostic Studies: No results found.  Microbiology: No results found for this or any previous visit (from the past 240 hour(s)).   Labs: Basic Metabolic Panel:  Recent Labs Lab 01/02/13 2024 01/03/13 0500  NA 125* 137  K 4.0 3.5  CL 85* 101  CO2 25 26  GLUCOSE 692* 284*  BUN 32* 26*  CREATININE 1.94* 1.46*  CALCIUM 9.9 9.0   Liver Function Tests:  Recent Labs Lab 01/02/13 2024 01/03/13 0500  AST 20 18  ALT 25 21  ALKPHOS 149* 122*  BILITOT 0.5 0.4  PROT 7.8 6.8  ALBUMIN 4.1 3.5   No results found for this basename: LIPASE, AMYLASE,  in the last 168 hours No results found for this basename: AMMONIA,  in the last 168 hours CBC:  Recent Labs Lab 01/02/13 2024 01/03/13 0500  WBC 8.9 7.0  NEUTROABS 6.1 3.4  HGB 16.4 14.7  HCT 42.3 38.9*  MCV 82.8 82.6  PLT 259 248   Cardiac Enzymes: No results found for this basename: CKTOTAL, CKMB, CKMBINDEX, TROPONINI,  in the last 168 hours BNP: BNP (last 3 results) No results found for this basename: PROBNP,   in the last 8760 hours CBG:  Recent Labs Lab 01/03/13 0336 01/03/13 0726 01/03/13 1131 01/03/13 1630 01/03/13 2250  GLUCAP 344* 275* 331* 269* 154*    Time coordinating discharge: 45 minutes  Signed:  Chirstine Defrain  [facility] 01/04/2013, 7:46 AM

## 2013-01-04 NOTE — Progress Notes (Signed)
Discussed discharge instructions and medications with pt. Pt showed no barriers to discharge. IV removed. Tele removed. Pt discharged to home with friend. Assessment unchanged from morning. Medications discussed at length with pt. He is new to Novolog insulin administration, so the sliding scale was discussed with pt, and pt expresses understanding. Pt is worried about cost of medication, deferred to case management. Enforced to pt not to get Novolog and Lantus insulin mixed up, and enforced importance of medication adherence.

## 2014-04-25 ENCOUNTER — Emergency Department (HOSPITAL_COMMUNITY): Payer: Medicare Other

## 2014-04-25 ENCOUNTER — Inpatient Hospital Stay (HOSPITAL_COMMUNITY)
Admission: EM | Admit: 2014-04-25 | Discharge: 2014-04-28 | DRG: 815 | Disposition: A | Discharge status: Home / Self Care | Payer: Medicare Other | Attending: Internal Medicine | Admitting: Internal Medicine

## 2014-04-25 ENCOUNTER — Encounter (HOSPITAL_COMMUNITY): Payer: Self-pay | Admitting: Emergency Medicine

## 2014-04-25 DIAGNOSIS — E785 Hyperlipidemia, unspecified: Secondary | ICD-10-CM | POA: Diagnosis present

## 2014-04-25 DIAGNOSIS — I129 Hypertensive chronic kidney disease with stage 1 through stage 4 chronic kidney disease, or unspecified chronic kidney disease: Secondary | ICD-10-CM | POA: Diagnosis present

## 2014-04-25 DIAGNOSIS — Z9079 Acquired absence of other genital organ(s): Secondary | ICD-10-CM

## 2014-04-25 DIAGNOSIS — K5900 Constipation, unspecified: Secondary | ICD-10-CM | POA: Diagnosis present

## 2014-04-25 DIAGNOSIS — I252 Old myocardial infarction: Secondary | ICD-10-CM

## 2014-04-25 DIAGNOSIS — N189 Chronic kidney disease, unspecified: Secondary | ICD-10-CM | POA: Diagnosis present

## 2014-04-25 DIAGNOSIS — Z794 Long term (current) use of insulin: Secondary | ICD-10-CM

## 2014-04-25 DIAGNOSIS — I889 Nonspecific lymphadenitis, unspecified: Principal | ICD-10-CM | POA: Diagnosis present

## 2014-04-25 DIAGNOSIS — I10 Essential (primary) hypertension: Secondary | ICD-10-CM | POA: Diagnosis present

## 2014-04-25 DIAGNOSIS — N182 Chronic kidney disease, stage 2 (mild): Secondary | ICD-10-CM

## 2014-04-25 DIAGNOSIS — I891 Lymphangitis: Secondary | ICD-10-CM

## 2014-04-25 DIAGNOSIS — E1165 Type 2 diabetes mellitus with hyperglycemia: Secondary | ICD-10-CM | POA: Diagnosis present

## 2014-04-25 DIAGNOSIS — E7800 Pure hypercholesterolemia, unspecified: Secondary | ICD-10-CM | POA: Diagnosis present

## 2014-04-25 DIAGNOSIS — L03119 Cellulitis of unspecified part of limb: Secondary | ICD-10-CM | POA: Diagnosis present

## 2014-04-25 DIAGNOSIS — IMO0002 Reserved for concepts with insufficient information to code with codable children: Secondary | ICD-10-CM

## 2014-04-25 DIAGNOSIS — I509 Heart failure, unspecified: Secondary | ICD-10-CM | POA: Diagnosis present

## 2014-04-25 DIAGNOSIS — R591 Generalized enlarged lymph nodes: Secondary | ICD-10-CM

## 2014-04-25 DIAGNOSIS — Z8673 Personal history of transient ischemic attack (TIA), and cerebral infarction without residual deficits: Secondary | ICD-10-CM

## 2014-04-25 DIAGNOSIS — IMO0001 Reserved for inherently not codable concepts without codable children: Secondary | ICD-10-CM | POA: Diagnosis present

## 2014-04-25 DIAGNOSIS — I2510 Atherosclerotic heart disease of native coronary artery without angina pectoris: Secondary | ICD-10-CM | POA: Diagnosis present

## 2014-04-25 DIAGNOSIS — L02619 Cutaneous abscess of unspecified foot: Secondary | ICD-10-CM | POA: Diagnosis present

## 2014-04-25 DIAGNOSIS — E871 Hypo-osmolality and hyponatremia: Secondary | ICD-10-CM | POA: Diagnosis present

## 2014-04-25 DIAGNOSIS — M79661 Pain in right lower leg: Secondary | ICD-10-CM

## 2014-04-25 DIAGNOSIS — M79609 Pain in unspecified limb: Secondary | ICD-10-CM | POA: Diagnosis present

## 2014-04-25 DIAGNOSIS — I5032 Chronic diastolic (congestive) heart failure: Secondary | ICD-10-CM | POA: Diagnosis present

## 2014-04-25 LAB — COMPREHENSIVE METABOLIC PANEL
ALBUMIN: 3.7 g/dL (ref 3.5–5.2)
ALK PHOS: 90 U/L (ref 39–117)
ALT: 23 U/L (ref 0–53)
ANION GAP: 15 (ref 5–15)
AST: 33 U/L (ref 0–37)
BILIRUBIN TOTAL: 0.5 mg/dL (ref 0.3–1.2)
BUN: 23 mg/dL (ref 6–23)
CHLORIDE: 100 meq/L (ref 96–112)
CO2: 21 mEq/L (ref 19–32)
Calcium: 9 mg/dL (ref 8.4–10.5)
Creatinine, Ser: 1.57 mg/dL — ABNORMAL HIGH (ref 0.50–1.35)
GFR calc Af Amer: 48 mL/min — ABNORMAL LOW (ref >90)
GFR calc non Af Amer: 41 mL/min — ABNORMAL LOW (ref >90)
Glucose, Bld: 86 mg/dL (ref 70–99)
POTASSIUM: 4.4 meq/L (ref 3.7–5.3)
Sodium: 136 mEq/L — ABNORMAL LOW (ref 137–147)
Total Protein: 7.1 g/dL (ref 6.0–8.3)

## 2014-04-25 LAB — CBC WITH DIFFERENTIAL/PLATELET
BASOS PCT: 0 % (ref 0–1)
Basophils Absolute: 0 10*3/uL (ref 0.0–0.1)
Eosinophils Absolute: 0 10*3/uL (ref 0.0–0.7)
Eosinophils Relative: 0 % (ref 0–5)
HCT: 41.4 % (ref 39.0–52.0)
HEMOGLOBIN: 14.4 g/dL (ref 13.0–17.0)
LYMPHS PCT: 4 % — AB (ref 12–46)
Lymphs Abs: 0.7 10*3/uL (ref 0.7–4.0)
MCH: 32.3 pg (ref 26.0–34.0)
MCHC: 34.8 g/dL (ref 30.0–36.0)
MCV: 92.8 fL (ref 78.0–100.0)
MONOS PCT: 4 % (ref 3–12)
Monocytes Absolute: 0.6 10*3/uL (ref 0.1–1.0)
NEUTROS ABS: 15.6 10*3/uL — AB (ref 1.7–7.7)
NEUTROS PCT: 92 % — AB (ref 43–77)
Platelets: 177 10*3/uL (ref 150–400)
RBC: 4.46 MIL/uL (ref 4.22–5.81)
RDW: 14.3 % (ref 11.5–15.5)
WBC: 16.9 10*3/uL — AB (ref 4.0–10.5)

## 2014-04-25 LAB — I-STAT CG4 LACTIC ACID, ED: LACTIC ACID, VENOUS: 2.77 mmol/L — AB (ref 0.5–2.2)

## 2014-04-25 LAB — URINALYSIS, ROUTINE W REFLEX MICROSCOPIC
BILIRUBIN URINE: NEGATIVE
GLUCOSE, UA: NEGATIVE mg/dL
KETONES UR: NEGATIVE mg/dL
Leukocytes, UA: NEGATIVE
Nitrite: NEGATIVE
PH: 7.5 (ref 5.0–8.0)
Protein, ur: 30 mg/dL — AB
Specific Gravity, Urine: 1.02 (ref 1.005–1.030)
Urobilinogen, UA: 0.2 mg/dL (ref 0.0–1.0)

## 2014-04-25 LAB — URINE MICROSCOPIC-ADD ON

## 2014-04-25 MED ORDER — SODIUM CHLORIDE 0.9 % IV SOLN
INTRAVENOUS | Status: DC
  Administered 2014-04-25: 20:00:00 via INTRAVENOUS

## 2014-04-25 MED ORDER — VANCOMYCIN HCL IN DEXTROSE 1-5 GM/200ML-% IV SOLN
1000.0000 mg | Freq: Once | INTRAVENOUS | Status: AC
  Administered 2014-04-26: 1000 mg via INTRAVENOUS
  Filled 2014-04-25: qty 200

## 2014-04-25 MED ORDER — IOHEXOL 300 MG/ML  SOLN
25.0000 mL | Freq: Once | INTRAMUSCULAR | Status: AC | PRN
  Administered 2014-04-25: 25 mL via ORAL

## 2014-04-25 MED ORDER — SODIUM CHLORIDE 0.9 % IV BOLUS (SEPSIS)
500.0000 mL | Freq: Once | INTRAVENOUS | Status: AC
  Administered 2014-04-25: 500 mL via INTRAVENOUS

## 2014-04-25 MED ORDER — DEXTROSE 5 % IV SOLN
1.0000 g | Freq: Once | INTRAVENOUS | Status: AC
  Administered 2014-04-25: 1 g via INTRAVENOUS
  Filled 2014-04-25: qty 10

## 2014-04-25 MED ORDER — IOHEXOL 300 MG/ML  SOLN
80.0000 mL | Freq: Once | INTRAMUSCULAR | Status: AC | PRN
  Administered 2014-04-25: 80 mL via INTRAVENOUS

## 2014-04-25 MED ORDER — ACETAMINOPHEN 650 MG RE SUPP
650.0000 mg | Freq: Once | RECTAL | Status: AC
  Administered 2014-04-25: 650 mg via RECTAL
  Filled 2014-04-25: qty 1

## 2014-04-25 NOTE — ED Notes (Signed)
Pt getting chest x-ray at this time. 

## 2014-04-25 NOTE — ED Notes (Signed)
Applied ice packs to back and left groin to reduce pt's fever.

## 2014-04-25 NOTE — H&P (Signed)
Date: 04/26/2014               Patient Name:  Alex Franco Spittler MRN: [id number]  DOB: 12-27-1940 Age / Sex: 73 y.o., male   PCP: Provider Not In System         Medical Service: Internal Medicine Teaching Service         Attending Physician: Dr. Aletta EdouardShilpa Bhardwaj, MD    First Contact: Dr. Allena KatzPatel Pager: [phone]  Second Contact: Dr. Garald BraverKennerly Pager: [phone]       After Hours (After 5p/  First Contact Pager: [phone]  weekends / holidays): Second Contact Pager: [phone]   Chief Complaint: right groin swelling  History of Present Illness: Mr. Alex Franco is a 73 yo male with PMHx of Type II DM, CKD, HTN, and HLD who presents to the ED with complaint of right groin swelling. Yesterday, pt states he had spaghetti for lunch around 1 pm and then proceed to have several episodes of non-bloody vomiting and diarrhea. The vomiting and diarrhea persisted into the night. As he was walking around that day, he felt a tender, swollen area in his right groin. He tried pressing on the area, but it did not resolve. His symptoms were also associated with chills. Over the night, the vomiting and diarrhea resolved, but today he has had this persistent, tender, right groin swelling. His girlfriend came to check on him and noticed he was weak and felt hot. She was able to convince him to come into the ED. Pt denies being around any sick contacts. He denies ever having a swelling like this before. He is sexually active with one male partner. 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 He denies any discharge, itching, dysuria, sores or suprapubic pain. Patient denies any wounds or rashes on his skin.   Meds: Current Facility-Administered Medications  Medication Dose Route Frequency Provider Last Rate Last Dose  . 0.9 %  sodium chloride infusion   Intravenous Continuous Courtney ParisEden W Jones, MD 125 mL/hr at 04/26/14 0153    . acetaminophen (TYLENOL) tablet 650 mg  650 mg Oral Q6H PRN Courtney ParisEden W Jones, MD   650 mg at  04/26/14 0110   Or  . acetaminophen (TYLENOL) suppository 650 mg  650 mg Rectal Q6H PRN Courtney ParisEden W Jones, MD      . amLODipine (NORVASC) tablet 10 mg  10 mg Oral Daily Courtney ParisEden W Jones, MD      . aspirin chewable tablet 81 mg  81 mg Oral Daily Courtney ParisEden W Jones, MD      . atorvastatin (LIPITOR) tablet 20 mg  20 mg Oral Daily Courtney ParisEden W Jones, MD      . Melene Muller[START ON 04/27/2014] cefTRIAXone (ROCEPHIN) 1 g in dextrose 5 % 50 mL IVPB  1 g Intravenous Q24H Colleen CanVeronda Pauline Bryk, [facility]      . heparin injection 5,000 Units  5,000 Units Subcutaneous 3 times per day Courtney ParisEden W Jones, MD      . insulin aspart (novoLOG) injection 0-15 Units  0-15 Units Subcutaneous TID WC Courtney ParisEden W Jones, MD      . Melene Muller[START ON 04/27/2014] metoprolol tartrate (LOPRESSOR) tablet 25 mg  25 mg Oral BID Courtney ParisEden W Jones, MD      . multivitamin with minerals tablet 1 tablet  1 tablet Oral Daily Courtney ParisEden W Jones, MD      . ondansetron [facility](ZOFRAN) tablet 4 mg  4 mg Oral Q6H PRN Courtney ParisEden W Jones, MD  Or  . ondansetron (ZOFRAN) injection 4 mg  4 mg Intravenous Q6H PRN Courtney Paris, MD      . sodium chloride 0.9 % injection 3 mL  3 mL Intravenous Q12H Courtney Paris, MD      . vancomycin (VANCOCIN) 1,500 mg in sodium chloride 0.9 % 500 mL IVPB  1,500 mg Intravenous Q24H Colleen Can, [facility]        Allergies: Allergies as of 04/25/2014 - Review Complete 04/25/2014  Allergen Reaction Noted  . Other Anaphylaxis 01/02/2013   Past Medical History  Diagnosis Date  . Diabetes mellitus without complication   . Hypertension   . Hypercholesterolemia    Past Surgical History  Procedure Laterality Date  . Knee surgery     History reviewed. No pertinent family history. History   Social History  . Marital Status: Single    Spouse Name: N/A    Number of Children: N/A  . Years of Education: N/A   Occupational History  . Not on file.   Social History Main Topics  . Smoking status: Never Smoker   . Smokeless tobacco: Not on file  . Alcohol Use: No  . Drug Use: No    . Sexual Activity: Not on file   Other Topics Concern  . Not on file   Social History Narrative  . No narrative on file    Review of Systems: General: Admits to fever and chills. Denies fatigue, change in appetite and diaphoresis.  Respiratory: Denies SOB, cough, DOE, chest tightness, and wheezing.   Cardiovascular: Denies chest pain and palpitations.  Gastrointestinal: Admits to nausea, vomiting, and diarrhea--all of which have resolved. Denies abdominal pain, constipation, blood in stool and abdominal distention.  Genitourinary: Denies dysuria, discharge, itching, rashes or ulcers. Denies urgency, frequency, hematuria, suprapubic pain and flank pain. Endocrine: Denies hot or cold intolerance, polyuria, and polydipsia. Musculoskeletal: Admits to right leg pain. Denies myalgias, back pain, joint swelling, arthralgias and gait problem.  Skin: Admits to right grown swelling and tenderness. Denies pallor, rash and wounds.  Neurological: Denies dizziness, headaches, lightheadedness, numbness,seizures, and syncope, Psychiatric/Behavioral: Denies mood changes, confusion, nervousness, sleep disturbance and agitation.  Physical Exam: Filed Vitals:   04/26/14 0015 04/26/14 0105 04/26/14 0109 04/26/14 0149  BP: 143/73  143/64 154/73  Pulse: 100  102 104  Temp:  102.2 F (39 C)  98.8 F (37.1 C)  TempSrc:  Oral  Oral  Resp: 26  20 20   Height:    5\' 8"  (1.727 m)  Weight:    223 lb 1.6 oz (101.197 kg)  SpO2: 97%  95% 96%   General: Vital signs reviewed.  Patient is well-developed and well-nourished, in no acute distress and cooperative with exam.  Head: Normocephalic and atraumatic. Eyes: EOMI, conjunctivae normal, no scleral icterus.  Neck: Supple, trachea midline, normal ROM, no JVD, masses, thyromegaly, or carotid bruit present.  Cardiovascular: Tachycardic, regular rhythm, S1 normal, S2 normal, no murmurs, gallops, or rubs. Pulmonary/Chest: Clear to auscultation bilaterally, no  wheezes, rales, or rhonchi. Abdominal: Soft, non-tender, hyperactive bowel sounds, non-distended, no masses, organomegaly, or guarding present.  Musculoskeletal: No joint deformities, erythema, or stiffness, ROM full and nontender. Extremities: Edematous, erythematous, soft, mobile and tender lesion on right superior, anterior right extremity adjacent to groin with erythematous streaking along medial right lower extremity distally.  Trace to 1+ pitting edema on right lower extremity edema greater than left lower extremity, tenderness on palpation of right foot and right calf.  pulses symmetric and  intact bilaterally. No cyanosis or clubbing. Neurological: A&O x3, Strength is normal and symmetric bilaterally, cranial nerve II-XII are grossly intact, no focal motor deficit, sensory intact to light touch bilaterally.  Skin: No obvious signs of ulceration, wounds, skin infection, breaks in the skin. Warm, dry and intact. Erythematous streaks along right lower extremity. Psychiatric: Normal mood and affect. speech and behavior is normal. Cognition and memory are normal.   Lab results: Basic Metabolic Panel:  Recent Labs  16/10/96 1807  NA 136*  K 4.4  CL 100  CO2 21  GLUCOSE 86  BUN 23  CREATININE 1.57*  CALCIUM 9.0   Liver Function Tests:  Recent Labs  04/25/14 1807  AST 33  ALT 23  ALKPHOS 90  BILITOT 0.5  PROT 7.1  ALBUMIN 3.7   CBC:  Recent Labs  04/25/14 1807  WBC 16.9*  NEUTROABS 15.6*  HGB 14.4  HCT 41.4  MCV 92.8  PLT 177   Urinalysis:  Recent Labs  04/25/14 1828  COLORURINE YELLOW  LABSPEC 1.020  PHURINE 7.5  GLUCOSEU NEGATIVE  HGBUR TRACE*  BILIRUBINUR NEGATIVE  KETONESUR NEGATIVE  PROTEINUR 30*  UROBILINOGEN 0.2  NITRITE NEGATIVE  LEUKOCYTESUR NEGATIVE    Imaging results:  Ct Abdomen Pelvis W Contrast  04/25/2014   CLINICAL DATA:  Knot in the right upper thigh and groin area since Thursday. Nausea, vomiting, and diarrhea for 2 days attributed  to food poisoning. Continued groin pain and right inguinal mass.  EXAM: CT ABDOMEN AND PELVIS WITH CONTRAST  TECHNIQUE: Multidetector CT imaging of the abdomen and pelvis was performed using the standard protocol following bolus administration of intravenous contrast.  CONTRAST:  80mL OMNIPAQUE IOHEXOL 300 MG/ML  SOLN  COMPARISON:  None.  FINDINGS: Calcified granulomas in the lung bases. Calcified mediastinal and hilar lymph nodes. Coronary artery calcifications.  Calcified granulomas demonstrated in the liver and spleen. No other focal lesions. Small accessory spleen. The gallbladder, pancreas, adrenal glands, abdominal aorta, inferior vena cava, and retroperitoneal lymph nodes are unremarkable. Multiple cysts in both kidneys, largest on the right measuring 8 cm diameter. No solid mass or hydronephrosis. Stomach, small bowel, and colon are unremarkable. Contrast material flows to the colon suggesting no evidence of obstruction. No free air or free fluid in the abdomen.  Pelvis: The appendix is normal. Scattered diverticula in the sigmoid and descending colon without evidence of diverticulitis. No free or loculated pelvic fluid collections. Prostate gland is not enlarged. No pelvic mass or lymphadenopathy. In the right groin, there is an enlarged lymph node with a fatty hilum measuring 1.6 x 3.6 by 2.1 cm. There is inflammatory stranding in the fat around this lymph node, consistent with inflammatory or infectious node. Additional lymph nodes in the groin regions are not enlarged. Degenerative changes in the lumbar spine. No destructive bone lesions.  IMPRESSION: No focal acute process demonstrated in the abdomen or pelvis. In the right groin, there is an enlarged lymph node with fatty hilum and surrounding inflammatory stranding suggesting infected or inflammatory node.   Electronically Signed   By: Burman Nieves M.D.   On: 04/25/2014 22:18   Dg Chest Port 1 View  04/25/2014   CLINICAL DATA:  Fever  EXAM:  PORTABLE CHEST - 1 VIEW  COMPARISON:  04/21/2011  FINDINGS: Lungs are essentially clear. No focal consolidation. No pleural effusion or pneumothorax.  Elevation of right hemidiaphragm.  The heart is top-normal size for inspiration.  IMPRESSION: No evidence of acute cardiopulmonary disease.  Mild elevation of  the right hemidiaphragm.   Electronically Signed   By: Charline Bills M.D.   On: 04/25/2014 21:39    Assessment & Plan by Problem:  Inguinal Lymphadenitis: Pt presents with two day history of tender, right groin swelling. Pt has also had a 2 day history of fever, nausea, vomiting, and diarrhea. Vitals signs on admission T 98.2 > Tmax 103.7, BP 141/66, HR 96, RR 18, pulse ox 99% on room air. Labs show leukocytosis of 16.9 and lactic acid of 2.77. On physical exam, pt has an edematous, erythematous, soft, mobile and tender lesion on right superior, anterior right extremity adjacent to groin with erythematous streaking along medial right lower extremity distally and tenderness on palpation of right foot and right calf. CT abdomen/pelvis shows an enlarged lymph node in the right groin with fatty hilum and surrounding inflammatory stranding suggesting infected or inflammatory node. Etiology of lymphadenitis is unknown at this time. Pt denies any symptoms or signs of STDs and is monogamous with one male partner. Pt denies any UTI or urinary symptoms, no dysuria. Urinalysis negative for nitrites or leukocytes. Denies cough or shortness of breath. CXR shows no evidence of acute cardiopulmonary disease and mild elevation of the right hemidiaphragm. Intraabdominal infectious process if of concern with symptoms of nausea, vomiting, diarrhea, but all symptoms have resolved and CT abdomen/pelvis shows no focal acute process demonstrated in the abdomen or pelvis. No evidence of diverticulitis. Pt received Vancomycin 1 gram IV, Ceftriaxone 1 gram IV, NS 500 mL IV bolus, and APAP 650 mg once in the ED. -NS 125  mL/hr -Continue vancomycin and rocephin -Repeat CBC/BMP/lactic acid in am -RPR, HIV antibody, GC/Chlamydia -Right lower extremity venous duplex  Right calf tenderness: Pt had right calf and right foot tenderness on exam. Pt also has erythematous streaking along medial lower extremity. Increased warmth. Calf tenderness is likely due to inguinal lymphadenitis and hematogenous spread, but DVT cannot be ruled out. Pt denies any history of DVT. States he is active and mobile. No recent trauma or surgeries. No shortness of breath. Pulse ox 99% on room air. 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 On amlodipine 10 mg daily, atorvastatin 20 mg daily and metoprolol tartrate 25 mg BID at home. No complaints of chest pain or shortness of breath. -EKG pending -Continue amlodipine, statin and metoprolol  HTN: BP 141/66 on admission. Stable. On amlodipine 10 mg daily and metoprolol tartrate 25 mg BID at home. -Continue amlodipine and lopressor  HLD: No prior lipid panel results. On atorvastatin 20 mg daily at home. -Check lipid panel -Continue atorvastatin  H/o CVA: 2 CVAs in 1987. No residual symptoms or deficits. No obvious neurologic deficits.  -Continue amlodipine, statin and metoprolol  Gout: History of gout. Last attack in February 2013. Pt has medication at home, but unsure what it is called. He does not take it. Patient has right sided foot pain, but only tender over dorsum. No podagra or joint tenderness. -Stable  Type II DM: Glucose on admission 86. Last hemoglobin A1c 14.2 on 01/03/2013. On Novolin and Lantus 30 units QHS at home. -Check HgbA1C -CBG checks 4 times daily -Moderate SSI TID -Cardiac diet  Grade 1 Diastolic CHF: 2D echocardiogram on 11/01/2011 shows systolic function was normal. The estimated ejection fraction was in the range of 55% to 65%. Wall motion was normal; there were no regional wall motion abnormalities.  Doppler parameters are consistent with abnormal left ventricular  relaxation (grade 1diastolic dysfunction). No complaints of chest pain, shortness of breath, weight gain or lower extremity swelling. -Continue atorvastatin, metoprolol, and norvasc  Chronic Kidney Disease: Creatinine on admission 1.57. His baseline is 1.6. -Stable  DVT/PE ppx: Heparin 5000 units TID  Dispo: Disposition is deferred at this time, awaiting improvement of current medical problems. Anticipated discharge in approximately 1-2 day(s).   The patient does not have a current PCP (Provider Not In System) and does not need an [facility] hospital follow-up appointment after discharge.  The patient does not have transportation limitations that hinder transportation to clinic appointments.  Signed: Jill Alexanders, DO PGY-1 Internal Medicine Resident Pager # [phone] 04/26/2014 2:11 AM

## 2014-04-25 NOTE — ED Notes (Signed)
Pt in today c/o "knot" to right upper thigh/groin area since Thursday, states on Thursday he thinks he got food poisoning and had n/v/d for two days, those symptoms have mostly resolved now but pain continues

## 2014-04-25 NOTE — ED Notes (Signed)
Pt reports nausea/vomiting and constipation x 2 days. States he is no longer nauseated, but noticed a swollen area "knot" on right upper thigh next to groin area that is painful to touch and with activity. Has not taken any OTC pain meds for the pain.

## 2014-04-25 NOTE — ED Notes (Signed)
Blood cultures drawn prior to antibiotic being started.

## 2014-04-25 NOTE — ED Provider Notes (Signed)
CSN: [id number]     Arrival date & time 04/25/14  1617 History   First MD Initiated Contact with Patient 04/25/14 1655     Chief Complaint  Patient presents with  . Groin Pain     (Consider location/radiation/quality/duration/timing/severity/associated sxs/prior Treatment) HPI Alex Franco is a 73 y.o. male who presents for evaluation of right groin swelling, onset after episodes of vomiting last night. The mass is new. Yesterday. He ate some spaghetti that he feels as bad and he began vomiting after that. He had numerous episodes of vomiting, the emesis is yellow color. He also had numerous episodes of diarrhea, thin, and brown in color. No vomiting, or diarrhea, since this morning. He has pain in his right groin, associated with the mass. He's never had this previously. He denies fever, chills, cough, chest pain, weakness, dizziness. There are no other known modifying factors.   Past Medical History  Diagnosis Date  . Diabetes mellitus without complication   . Hypertension   . Hypercholesterolemia    Past Surgical History  Procedure Laterality Date  . Knee surgery     History reviewed. No pertinent family history. History  Substance Use Topics  . Smoking status: Never Smoker   . Smokeless tobacco: Not on file  . Alcohol Use: No    Review of Systems  All other systems reviewed and are negative.     Allergies  Other  Home Medications   Prior to Admission medications   Medication Sig Start Date End Date Taking? Authorizing Provider  amLODipine (NORVASC) 10 MG tablet Take 10 mg by mouth daily.   Yes Historical Provider, MD  aspirin 81 MG chewable tablet Chew 81 mg by mouth daily.   Yes Historical Provider, MD  atorvastatin (LIPITOR) 40 MG tablet Take 20 mg by mouth daily.    Yes Historical Provider, MD  insulin glargine (LANTUS) 100 UNIT/ML injection Inject 0.3 mLs (30 Units total) into the skin at bedtime. 01/04/13  Yes Renae Fickle, MD  Insulin Regular Human (NOVOLIN R  IJ) Inject 10 Units as directed daily as needed. Take only when sugar is high per patient   Yes Historical Provider, MD  metoprolol tartrate (LOPRESSOR) 25 MG tablet Take 25 mg by mouth 2 (two) times daily.   Yes Historical Provider, MD  Multiple Vitamin (MULTIVITAMIN WITH MINERALS) TABS Take 1 tablet by mouth daily.   Yes Historical Provider, MD   BP 158/67  Pulse 107  Temp(Src) 100.1 F (37.8 C) (Oral)  Resp 26  SpO2 97% Physical Exam  Nursing note and vitals reviewed. Constitutional: He is oriented to person, place, and time. He appears well-developed and well-nourished.  HENT:  Head: Normocephalic and atraumatic.  Right Ear: External ear normal.  Left Ear: External ear normal.  Eyes: Conjunctivae and EOM are normal. Pupils are equal, round, and reactive to light.  Neck: Normal range of motion and phonation normal. Neck supple.  Cardiovascular: Normal rate, regular rhythm, normal heart sounds and intact distal pulses.   Pulmonary/Chest: Effort normal and breath sounds normal. He exhibits no bony tenderness.  Abdominal: Soft. Bowel sounds are normal.  There is a tender mass, associated with the right inguinal region, approximately 2 cm below the inguinal ligament, the mass measures approximately 4 x 2 cm, by palpation. There is mild redness of the overlying skin. The mass cannot be reduced by gentle pressure.   Musculoskeletal: Normal range of motion.  Neurological: He is alert and oriented to person, place, and time. No cranial nerve  deficit or sensory deficit. He exhibits normal muscle tone. Coordination normal.  Skin: Skin is warm, dry and intact.  Psychiatric: He has a normal mood and affect. His behavior is normal. Judgment and thought content normal.    ED Course  Procedures (including critical care time)  Medications  0.9 %  sodium chloride infusion ( Intravenous New Bag/Given 04/25/14 1945)  sodium chloride 0.9 % bolus 500 mL (0 mLs Intravenous Stopped 04/25/14 1951)   iohexol (OMNIPAQUE) 300 MG/ML solution 25 mL (25 mLs Oral Contrast Given 04/25/14 1828)  acetaminophen (TYLENOL) suppository 650 mg (650 mg Rectal Given 04/25/14 1832)  iohexol (OMNIPAQUE) 300 MG/ML solution 80 mL (80 mLs Intravenous Contrast Given 04/25/14 2156)    Patient Vitals for the past 24 hrs:  BP Temp Temp src Pulse Resp SpO2  04/25/14 2300 158/67 mmHg - - 107 26 97 %  04/25/14 2245 155/67 mmHg - - 102 29 99 %  04/25/14 2145 146/70 mmHg - - 104 - 98 %  04/25/14 2142 - - - 103 32 99 %  04/25/14 2115 142/86 mmHg 100.1 F (37.8 C) Oral 102 30 98 %  04/25/14 2100 162/68 mmHg - - 103 - 99 %  04/25/14 2008 - 103 F (39.4 C) Oral - - -  04/25/14 1945 150/64 mmHg - - 103 - 98 %  04/25/14 1915 152/65 mmHg - - 105 - 97 %  04/25/14 1845 170/66 mmHg - - - - 99 %  04/25/14 1830 - - - - - 98 %  04/25/14 1827 - 103.7 F (39.8 C) Rectal - - -  04/25/14 1815 187/84 mmHg - - - - 98 %  04/25/14 1800 153/76 mmHg - - - - 99 %  04/25/14 1745 170/78 mmHg - - - - 100 %  04/25/14 1730 174/75 mmHg - - - - 100 %  04/25/14 1715 137/72 mmHg - - 91 - 100 %  04/25/14 1700 112/93 mmHg - - 100 - 100 %  04/25/14 1636 141/66 mmHg 98.2 F (36.8 C) Oral 96 18 99 %    11:09 PM Reevaluation with update and discussion. After initial assessment and treatment, an updated evaluation reveals he feels better, his temperature is improved. Right leg reevaluated. There is mild swelling of the right foot with tenderness. There are red streaks diffusely up left leg with tenderness and mild swelling of the left calf. Patient does not recall an injury to the right leg, or foot. Findings discussed with patient and family member. IV ABX ordered. [name]      Date: 04/26/14- MUSE hyperlink not operable  Rate: 98  Rhythm: normal sinus rhythm  QRS Axis: normal  PR and QT Intervals: normal  ST/T Wave abnormalities: nonspecific ST/T changes  PR and QRS Conduction Disutrbances:none  Narrative Interpretation:   Old EKG  Reviewed: unchanged, 04/20/11   Labs Review Labs Reviewed  CBC WITH DIFFERENTIAL - Abnormal; Notable for the following:    WBC 16.9 (*)    Neutrophils Relative % 92 (*)    Neutro Abs 15.6 (*)    Lymphocytes Relative 4 (*)    All other components within normal limits  COMPREHENSIVE METABOLIC PANEL - Abnormal; Notable for the following:    Sodium 136 (*)    Creatinine, Ser 1.57 (*)    GFR calc non Af Amer 41 (*)    GFR calc Af Amer 48 (*)    All other components within normal limits  URINALYSIS, ROUTINE W REFLEX MICROSCOPIC - Abnormal; Notable for  the following:    Hgb urine dipstick TRACE (*)    Protein, ur 30 (*)    All other components within normal limits  URINE MICROSCOPIC-ADD ON - Abnormal; Notable for the following:    Squamous Epithelial / LPF FEW (*)    All other components within normal limits  I-STAT CG4 LACTIC ACID, ED - Abnormal; Notable for the following:    Lactic Acid, Venous 2.77 (*)    All other components within normal limits  URINE CULTURE  CULTURE, BLOOD (ROUTINE X 2)  CULTURE, BLOOD (ROUTINE X 2)    Imaging Review Ct Abdomen Pelvis W Contrast  04/25/2014   CLINICAL DATA:  Knot in the right upper thigh and groin area since Thursday. Nausea, vomiting, and diarrhea for 2 days attributed to food poisoning. Continued groin pain and right inguinal mass.  EXAM: CT ABDOMEN AND PELVIS WITH CONTRAST  TECHNIQUE: Multidetector CT imaging of the abdomen and pelvis was performed using the standard protocol following bolus administration of intravenous contrast.  CONTRAST:  80mL OMNIPAQUE IOHEXOL 300 MG/ML  SOLN  COMPARISON:  None.  FINDINGS: Calcified granulomas in the lung bases. Calcified mediastinal and hilar lymph nodes. Coronary artery calcifications.  Calcified granulomas demonstrated in the liver and spleen. No other focal lesions. Small accessory spleen. The gallbladder, pancreas, adrenal glands, abdominal aorta, inferior vena cava, and retroperitoneal lymph nodes are  unremarkable. Multiple cysts in both kidneys, largest on the right measuring 8 cm diameter. No solid mass or hydronephrosis. Stomach, small bowel, and colon are unremarkable. Contrast material flows to the colon suggesting no evidence of obstruction. No free air or free fluid in the abdomen.  Pelvis: The appendix is normal. Scattered diverticula in the sigmoid and descending colon without evidence of diverticulitis. No free or loculated pelvic fluid collections. Prostate gland is not enlarged. No pelvic mass or lymphadenopathy. In the right groin, there is an enlarged lymph node with a fatty hilum measuring 1.6 x 3.6 by 2.1 cm. There is inflammatory stranding in the fat around this lymph node, consistent with inflammatory or infectious node. Additional lymph nodes in the groin regions are not enlarged. Degenerative changes in the lumbar spine. No destructive bone lesions.  IMPRESSION: No focal acute process demonstrated in the abdomen or pelvis. In the right groin, there is an enlarged lymph node with fatty hilum and surrounding inflammatory stranding suggesting infected or inflammatory node.   Electronically Signed   By: Burman NievesWilliam  Stevens M.D.   On: 04/25/2014 22:18   Dg Chest Port 1 View  04/25/2014   CLINICAL DATA:  Fever  EXAM: PORTABLE CHEST - 1 VIEW  COMPARISON:  04/21/2011  FINDINGS: Lungs are essentially clear. No focal consolidation. No pleural effusion or pneumothorax.  Elevation of right hemidiaphragm.  The heart is top-normal size for inspiration.  IMPRESSION: No evidence of acute cardiopulmonary disease.  Mild elevation of the right hemidiaphragm.   Electronically Signed   By: Charline BillsSriyesh  Krishnan M.D.   On: 04/25/2014 21:39     EKG Interpretation None      MDM   Final diagnoses:  Lymphangitis  Lymphadenopathy    Inflammatory lymph node, right groin, likely from a leg source. There is apparent descending lymphangitis, from an unclear source. There is no source of his discomfort, from an  intra-abdominal process. He'll need to be admitted for further treatment, management and diagnostic evaluation.   Nursing Notes Reviewed/ Care Coordinated, and agree without changes. Applicable Imaging Reviewed.  Interpretation of Laboratory Data incorporated into ED treatment  Plan: Admit    Flint Melter, MD 04/26/14 [phone]

## 2014-04-26 ENCOUNTER — Encounter (HOSPITAL_COMMUNITY): Payer: Self-pay | Admitting: *Deleted

## 2014-04-26 DIAGNOSIS — M79609 Pain in unspecified limb: Secondary | ICD-10-CM

## 2014-04-26 DIAGNOSIS — Z8673 Personal history of transient ischemic attack (TIA), and cerebral infarction without residual deficits: Secondary | ICD-10-CM

## 2014-04-26 DIAGNOSIS — I889 Nonspecific lymphadenitis, unspecified: Secondary | ICD-10-CM | POA: Diagnosis present

## 2014-04-26 DIAGNOSIS — Z9079 Acquired absence of other genital organ(s): Secondary | ICD-10-CM | POA: Diagnosis not present

## 2014-04-26 DIAGNOSIS — N189 Chronic kidney disease, unspecified: Secondary | ICD-10-CM | POA: Diagnosis present

## 2014-04-26 DIAGNOSIS — M7989 Other specified soft tissue disorders: Secondary | ICD-10-CM

## 2014-04-26 DIAGNOSIS — M79661 Pain in right lower leg: Secondary | ICD-10-CM | POA: Diagnosis present

## 2014-04-26 DIAGNOSIS — E7800 Pure hypercholesterolemia, unspecified: Secondary | ICD-10-CM | POA: Diagnosis present

## 2014-04-26 DIAGNOSIS — IMO0001 Reserved for inherently not codable concepts without codable children: Secondary | ICD-10-CM | POA: Diagnosis present

## 2014-04-26 DIAGNOSIS — K5900 Constipation, unspecified: Secondary | ICD-10-CM | POA: Diagnosis present

## 2014-04-26 DIAGNOSIS — I5030 Unspecified diastolic (congestive) heart failure: Secondary | ICD-10-CM

## 2014-04-26 DIAGNOSIS — I252 Old myocardial infarction: Secondary | ICD-10-CM | POA: Diagnosis not present

## 2014-04-26 DIAGNOSIS — E871 Hypo-osmolality and hyponatremia: Secondary | ICD-10-CM | POA: Diagnosis present

## 2014-04-26 DIAGNOSIS — I129 Hypertensive chronic kidney disease with stage 1 through stage 4 chronic kidney disease, or unspecified chronic kidney disease: Secondary | ICD-10-CM | POA: Diagnosis present

## 2014-04-26 DIAGNOSIS — Z794 Long term (current) use of insulin: Secondary | ICD-10-CM | POA: Diagnosis not present

## 2014-04-26 DIAGNOSIS — I509 Heart failure, unspecified: Secondary | ICD-10-CM

## 2014-04-26 DIAGNOSIS — E785 Hyperlipidemia, unspecified: Secondary | ICD-10-CM | POA: Diagnosis present

## 2014-04-26 DIAGNOSIS — D849 Immunodeficiency, unspecified: Secondary | ICD-10-CM

## 2014-04-26 DIAGNOSIS — I2510 Atherosclerotic heart disease of native coronary artery without angina pectoris: Secondary | ICD-10-CM | POA: Diagnosis present

## 2014-04-26 DIAGNOSIS — L02619 Cutaneous abscess of unspecified foot: Secondary | ICD-10-CM | POA: Diagnosis present

## 2014-04-26 DIAGNOSIS — L03119 Cellulitis of unspecified part of limb: Secondary | ICD-10-CM | POA: Diagnosis present

## 2014-04-26 DIAGNOSIS — M109 Gout, unspecified: Secondary | ICD-10-CM

## 2014-04-26 DIAGNOSIS — I5032 Chronic diastolic (congestive) heart failure: Secondary | ICD-10-CM | POA: Diagnosis present

## 2014-04-26 LAB — CBC
HCT: 37.1 % — ABNORMAL LOW (ref 39.0–52.0)
HEMOGLOBIN: 12.9 g/dL — AB (ref 13.0–17.0)
MCH: 31.4 pg (ref 26.0–34.0)
MCHC: 34.8 g/dL (ref 30.0–36.0)
MCV: 90.3 fL (ref 78.0–100.0)
PLATELETS: 188 10*3/uL (ref 150–400)
RBC: 4.11 MIL/uL — ABNORMAL LOW (ref 4.22–5.81)
RDW: 14.2 % (ref 11.5–15.5)
WBC: 15.5 10*3/uL — ABNORMAL HIGH (ref 4.0–10.5)

## 2014-04-26 LAB — BASIC METABOLIC PANEL
Anion gap: 15 (ref 5–15)
BUN: 23 mg/dL (ref 6–23)
CO2: 20 mEq/L (ref 19–32)
Calcium: 8.3 mg/dL — ABNORMAL LOW (ref 8.4–10.5)
Chloride: 100 mEq/L (ref 96–112)
Creatinine, Ser: 1.61 mg/dL — ABNORMAL HIGH (ref 0.50–1.35)
GFR, EST AFRICAN AMERICAN: 47 mL/min — AB (ref >90)
GFR, EST NON AFRICAN AMERICAN: 40 mL/min — AB (ref >90)
GLUCOSE: 115 mg/dL — AB (ref 70–99)
Potassium: 4 mEq/L (ref 3.7–5.3)
Sodium: 135 mEq/L — ABNORMAL LOW (ref 137–147)

## 2014-04-26 LAB — GLUCOSE, CAPILLARY
GLUCOSE-CAPILLARY: 105 mg/dL — AB (ref 70–99)
Glucose-Capillary: 151 mg/dL — ABNORMAL HIGH (ref 70–99)
Glucose-Capillary: 97 mg/dL (ref 70–99)
Glucose-Capillary: 142 mg/dL — ABNORMAL HIGH (ref 70–99)
Glucose-Capillary: 144 mg/dL — ABNORMAL HIGH (ref 70–99)

## 2014-04-26 LAB — SEDIMENTATION RATE: SED RATE: 38 mm/h — AB (ref 0–16)

## 2014-04-26 LAB — LIPID PANEL
CHOLESTEROL: 77 mg/dL (ref 0–200)
HDL: 43 mg/dL (ref >39)
LDL Cholesterol: 23 mg/dL (ref 0–99)
Total CHOL/HDL Ratio: 1.8 RATIO
Triglycerides: 53 mg/dL (ref <150)
VLDL: 11 mg/dL (ref 0–40)

## 2014-04-26 LAB — C-REACTIVE PROTEIN: CRP: 23.7 mg/dL — AB (ref <0.60)

## 2014-04-26 LAB — HEMOGLOBIN A1C
Hgb A1c MFr Bld: 6.2 % — ABNORMAL HIGH (ref <5.7)
Mean Plasma Glucose: 131 mg/dL — ABNORMAL HIGH (ref <117)

## 2014-04-26 LAB — LACTIC ACID, PLASMA: LACTIC ACID, VENOUS: 1 mmol/L (ref 0.5–2.2)

## 2014-04-26 LAB — HIV ANTIBODY (ROUTINE TESTING W REFLEX): HIV 1&2 Ab, 4th Generation: NONREACTIVE

## 2014-04-26 LAB — RPR

## 2014-04-26 MED ORDER — SODIUM CHLORIDE 0.9 % IJ SOLN
3.0000 mL | Freq: Two times a day (BID) | INTRAMUSCULAR | Status: DC
  Administered 2014-04-26 – 2014-04-28 (×5): 3 mL via INTRAVENOUS

## 2014-04-26 MED ORDER — ONDANSETRON HCL 4 MG/2ML IJ SOLN: 4.0000 mg | Freq: Four times a day (QID) | INTRAMUSCULAR | Status: DC | PRN

## 2014-04-26 MED ORDER — ACETAMINOPHEN 650 MG RE SUPP: 650.0000 mg | Freq: Four times a day (QID) | RECTAL | Status: DC | PRN

## 2014-04-26 MED ORDER — ACETAMINOPHEN 325 MG PO TABS
650.0000 mg | ORAL_TABLET | Freq: Four times a day (QID) | ORAL | Status: DC | PRN
  Administered 2014-04-26: 650 mg via ORAL
  Filled 2014-04-26: qty 2

## 2014-04-26 MED ORDER — ATORVASTATIN CALCIUM 20 MG PO TABS
20.0000 mg | ORAL_TABLET | Freq: Every day | ORAL | Status: DC
  Administered 2014-04-26 – 2014-04-28 (×3): 20 mg via ORAL
  Filled 2014-04-26 (×3): qty 1

## 2014-04-26 MED ORDER — AMLODIPINE BESYLATE 10 MG PO TABS
10.0000 mg | ORAL_TABLET | Freq: Every day | ORAL | Status: DC
  Administered 2014-04-26 – 2014-04-28 (×3): 10 mg via ORAL
  Filled 2014-04-26 (×3): qty 1

## 2014-04-26 MED ORDER — ONDANSETRON HCL 4 MG PO TABS: 4.0000 mg | ORAL_TABLET | Freq: Four times a day (QID) | ORAL | Status: DC | PRN

## 2014-04-26 MED ORDER — AZITHROMYCIN 250 MG PO TABS
250.0000 mg | ORAL_TABLET | Freq: Every day | ORAL | Status: DC
  Administered 2014-04-27 – 2014-04-28 (×2): 250 mg via ORAL
  Filled 2014-04-26 (×2): qty 1

## 2014-04-26 MED ORDER — INSULIN ASPART 100 UNIT/ML ~~LOC~~ SOLN
0.0000 [IU] | Freq: Three times a day (TID) | SUBCUTANEOUS | Status: DC
  Administered 2014-04-26 – 2014-04-27 (×3): 2 [IU] via SUBCUTANEOUS

## 2014-04-26 MED ORDER — SODIUM CHLORIDE 0.9 % IV SOLN
INTRAVENOUS | Status: AC
  Administered 2014-04-26 (×2): via INTRAVENOUS

## 2014-04-26 MED ORDER — HEPARIN SODIUM (PORCINE) 5000 UNIT/ML IJ SOLN
5000.0000 [IU] | Freq: Three times a day (TID) | INTRAMUSCULAR | Status: DC
  Administered 2014-04-26 – 2014-04-28 (×7): 5000 [IU] via SUBCUTANEOUS
  Filled 2014-04-26 (×10): qty 1

## 2014-04-26 MED ORDER — VANCOMYCIN HCL 10 G IV SOLR
1500.0000 mg | INTRAVENOUS | Status: DC
  Filled 2014-04-26: qty 1500

## 2014-04-26 MED ORDER — METOPROLOL TARTRATE 25 MG PO TABS
25.0000 mg | ORAL_TABLET | Freq: Two times a day (BID) | ORAL | Status: DC
  Administered 2014-04-27 – 2014-04-28 (×3): 25 mg via ORAL
  Filled 2014-04-26 (×4): qty 1

## 2014-04-26 MED ORDER — ASPIRIN 81 MG PO CHEW
81.0000 mg | CHEWABLE_TABLET | Freq: Every day | ORAL | Status: DC
  Administered 2014-04-26 – 2014-04-28 (×3): 81 mg via ORAL
  Filled 2014-04-26 (×3): qty 1

## 2014-04-26 MED ORDER — AZITHROMYCIN 500 MG PO TABS
500.0000 mg | ORAL_TABLET | Freq: Every day | ORAL | Status: AC
  Administered 2014-04-26: 500 mg via ORAL
  Filled 2014-04-26: qty 1

## 2014-04-26 MED ORDER — DEXTROSE 5 % IV SOLN
1.0000 g | INTRAVENOUS | Status: DC
  Administered 2014-04-26 – 2014-04-28 (×2): 1 g via INTRAVENOUS
  Filled 2014-04-26 (×2): qty 10

## 2014-04-26 MED ORDER — ADULT MULTIVITAMIN W/MINERALS CH
1.0000 | ORAL_TABLET | Freq: Every day | ORAL | Status: DC
  Administered 2014-04-26 – 2014-04-28 (×3): 1 via ORAL
  Filled 2014-04-26 (×3): qty 1

## 2014-04-26 NOTE — Progress Notes (Signed)
Subjective: No acute events overnight.  He tells [place] today that he has two aopted cats but denies being scratched by either of them. They are taken to the vet regularly.    He denies any international travel or being outdoors other than to mow the grass recently.  He feels ready to go home.   Objective: Vital signs in last 24 hours: Filed Vitals:   04/26/14 0149 04/26/14 0506 04/26/14 0937 04/26/14 1325  BP: 154/73 126/71 128/70 153/81  Pulse: 104 94  103  Temp: 98.8 F (37.1 C) 98.6 F (37 C)  98.6 F (37 C)  TempSrc: Oral Oral  Oral  Resp: _0 Height: _1  (1.727 m)     Weight: 223 lb 1.6 oz (101.197 kg)     SpO2: 96% 97%  99%   Weight change:   Intake/Output Summary (Last 24 hours) at 04/26/14 1331 Last data filed at 04/26/14 1222  Gross per 24 hour  Intake 522.92 ml  Output   1100 ml  Net -577.08 ml   General: resting in bed, NAD HEENT: PERRL, EOMI, no scleral icterus Cardiac: RRR, no rubs, murmurs or gallops Pulm: clear to auscultation bilaterally, no wheezes, rales, or rhonchi Abd: soft, nontender, nondistended, BS present Ext: rubbery, mobile lymph node in R inguinal area that is not tender to palpation; right leg has red confluence stretching over calf and anteriomedial leg; right foot markedly swollen compared to left foot tender to palpation Neuro: alert and oriented X3, cranial nerves II-XII grossly intact, strength and sensation to light touch equal in bilateral upper and lower extremities  Lab Results: Basic Metabolic Panel:  Recent Labs Lab 04/25/14 1807 04/26/14 0543  NA 136* 135*  K 4.4 4.0  CL 100 100  CO2 21 20  GLUCOSE 86 115*  BUN 23 23  CREATININE 1.57* 1.61*  CALCIUM 9.0 8.3*   CBC:  Recent Labs Lab 04/25/14 1807 04/26/14 0543  WBC 16.9* 15.5*  NEUTROABS 15.6*  --   HGB 14.4 12.9*  HCT 41.4 37.1*  MCV 92.8 90.3  PLT 177 188   CBG:  Recent Labs Lab 04/26/14 0216 04/26/14 0754 04/26/14 1223  GLUCAP 151* 97  105*    Recent Labs Lab 04/26/14 0543  CHOL 77  HDL 43  LDLCALC 23  TRIG 53  CHOLHDL 1.8   EKG: Reviewed and compared with 8/1. -Stable T-wave inversions in II, III, avF, V5, V6.  Medications: I have reviewed the patient's current medications. Scheduled Meds: . amLODipine  10 mg Oral Daily  . aspirin  81 mg Oral Daily  . atorvastatin  20 mg Oral Daily  . [START ON 04/27/2014] azithromycin  250 mg Oral Daily  . [START ON 04/27/2014] cefTRIAXone (ROCEPHIN)  IV  1 g Intravenous Q24H  . heparin  5,000 Units Subcutaneous 3 times per day  . insulin aspart  0-15 Units Subcutaneous TID WC  . [START ON 04/27/2014] metoprolol tartrate  25 mg Oral BID  . multivitamin with minerals  1 tablet Oral Daily  . sodium chloride  3 mL Intravenous Q12H   Continuous Infusions: . sodium chloride 125 mL/hr at 04/26/14 0608   PRN Meds:.acetaminophen, acetaminophen, ondansetron (ZOFRAN) IV, ondansetron Assessment/Plan: Principal Problem:   Inguinal lymphadenitis Active Problems:   Diabetes mellitus type 2, uncontrolled   Hyponatremia   CKD (chronic kidney disease)   Hyperlipidemia   Hypertension   Tenderness of right calf  Mr. Bearse is a 73 year old male with insulin-dependent DM2,  HTN, HLD, CKD, CAD (h/o MI), h/o CVA who presents with inguinal lymphadenitis and R calf tenderness.   Inguinal Lymphadenitis 2/2 Infection: Infection is suspected given acute onset, fever, WBC 16.9->15.5 after abx. Bartonella infection is possible given cats. Toxoplasmosis possible but less likely given his immunocompetence and the focal nature of his lymphadenitis. STDs possible but less likely given that he's been active with only one partner and absence of related symptoms. -Switch vancomycin to azithromycin (583m x 1 day + 259mx 4 days) to cover suspected microbes  -Check Bartonella, Toxo, HIV, RPR, GC/Chlamydia -Check ESR & CRP -Follow blood cultures  Right Calf Tenderness: DVT suspected given findings on  admission of erythema, warmth, pain, but symptoms had improved today. Less likely given that he has been mobile, denies dyspnea, and has no history of recent trauma, surgeries. -Confirm with LE venous dupplex -Continue heparin for DVT prophylaxis  CAD (h/o MI): MI in 1987. Stable EKG findings reassuring.  -Continue telemetry -Continue amlodipine 1035matorvastatin 31m33mnd metoprolol 25mg34m.  HTN: BP generally 140-150/70-80. -Continue amlodipine, metoprolol  HLD: LDL 23 which is at goal for his risk factors, including DM, CAD, h/o CVA.  -Continue atorvastatin   Insulin-dependent DM2: A1c 14.2 from 01/03/13 indicates poor control.  -Check CBGs QID -Continue moderate SSI & cardiac diet  CKD: Crt 1.6, up from 1.51 yesterday. Stable.  Diet: cardiac diet  DVT: heparin 5000 units TID  Dispo: Disposition is deferred at this time, awaiting improvement of current medical problems.  Anticipated discharge in approximately 1-2 day(s).   The patient does not have a current PCP (Provider Not In System) and does not need an [facility] appointment after discharge.  The patient does not have transportation limitations that hinder transportation to clinic appointments.  .Services Needed at time of discharge: Y = Yes, Blank = No PT:   OT:   RN:   Equipment:   Other:     LOS: 1 day   RushiCharlott Rakes8/09/2013, 1:31 PM

## 2014-04-26 NOTE — Discharge Summary (Signed)
Name: Alex Franco MRN: [id number] DOB: [birth year] 73 y.o. PCP: Provider Not In System  Date of Admission: 04/25/2014  4:50 PM Date of Discharge: 04/28/2014 Attending Physician: Aletta Edouard, MD  Discharge Diagnosis: Principal Problem:   Cellulitis of foot Active Problems:   Diabetes mellitus type 2, uncontrolled   Hyponatremia   CKD (chronic kidney disease)   Hyperlipidemia   Hypertension   Inguinal lymphadenitis   Tenderness of right calf  Discharge Medications:   Medication List         amLODipine 10 MG tablet  Commonly known as:  NORVASC  Take 10 mg by mouth daily.     aspirin 81 MG chewable tablet  Chew 81 mg by mouth daily.     atorvastatin 40 MG tablet  Commonly known as:  LIPITOR  Take 20 mg by mouth daily.     azithromycin 250 MG tablet  Commonly known as:  ZITHROMAX  Take 1 tablet (250 mg total) by mouth daily.     cephALEXin 500 MG capsule  Commonly known as:  KEFLEX  Take 1 capsule (500 mg total) by mouth 3 (three) times daily.     insulin glargine 100 UNIT/ML injection  Commonly known as:  LANTUS  Inject 0.3 mLs (30 Units total) into the skin at bedtime.     metoprolol tartrate 25 MG tablet  Commonly known as:  LOPRESSOR  Take 25 mg by mouth 2 (two) times daily.     multivitamin with minerals Tabs tablet  Take 1 tablet by mouth daily.     NOVOLIN R IJ  Inject 10 Units as directed daily as needed. Take only when sugar is high per patient        Disposition and follow-up:   Mr.[name] was discharged from [facility] in Stable condition.  At the hospital follow up visit please address:  1.  Resolution of right foot cellulitis and inguinal lymphadenitis  2.  Adherence to antibiotics  2.  Labs / imaging needed at time of follow-up: none  3.  Pending labs/ test needing follow-up: none  Follow-up Appointments: Follow-up Information   Follow up with [facility]. Schedule an appointment as soon as possible for  a visit in 1 week. (Please call to make an appointment for 1 week.)    Specialty:  General Practice   Contact information:   [address]  [facility] [place] [address] [phone]       Discharge Instructions: Discharge Instructions   Diet - low sodium heart healthy    Complete by:  As directed      Increase activity slowly    Complete by:  As directed            Consultations:   Infectious Diseases  Procedures Performed:  Ct Abdomen Pelvis W Contrast  04/25/2014   CLINICAL DATA:  Knot in the right upper thigh and groin area since Thursday. Nausea, vomiting, and diarrhea for 2 days attributed to food poisoning. Continued groin pain and right inguinal mass.  EXAM: CT ABDOMEN AND PELVIS WITH CONTRAST  TECHNIQUE: Multidetector CT imaging of the abdomen and pelvis was performed using the standard protocol following bolus administration of intravenous contrast.  CONTRAST:  80mL OMNIPAQUE IOHEXOL 300 MG/ML  SOLN  COMPARISON:  None.  FINDINGS: Calcified granulomas in the lung bases. Calcified mediastinal and hilar lymph nodes. Coronary artery calcifications.  Calcified granulomas demonstrated in the liver and spleen. No other focal lesions. Small accessory spleen. The gallbladder,  pancreas, adrenal glands, abdominal aorta, inferior vena cava, and retroperitoneal lymph nodes are unremarkable. Multiple cysts in both kidneys, largest on the right measuring 8 cm diameter. No solid mass or hydronephrosis. Stomach, small bowel, and colon are unremarkable. Contrast material flows to the colon suggesting no evidence of obstruction. No free air or free fluid in the abdomen.  Pelvis: The appendix is normal. Scattered diverticula in the sigmoid and descending colon without evidence of diverticulitis. No free or loculated pelvic fluid collections. Prostate gland is not enlarged. No pelvic mass or lymphadenopathy. In the right groin, there is an enlarged lymph node with a fatty hilum measuring 1.6 x 3.6 by 2.1 cm.  There is inflammatory stranding in the fat around this lymph node, consistent with inflammatory or infectious node. Additional lymph nodes in the groin regions are not enlarged. Degenerative changes in the lumbar spine. No destructive bone lesions.  IMPRESSION: No focal acute process demonstrated in the abdomen or pelvis. In the right groin, there is an enlarged lymph node with fatty hilum and surrounding inflammatory stranding suggesting infected or inflammatory node.   Electronically Signed   By: Burman Nieves M.D.   On: 04/25/2014 22:18   Dg Chest Port 1 View  04/25/2014   CLINICAL DATA:  Fever  EXAM: PORTABLE CHEST - 1 VIEW  COMPARISON:  04/21/2011  FINDINGS: Lungs are essentially clear. No focal consolidation. No pleural effusion or pneumothorax.  Elevation of right hemidiaphragm.  The heart is top-normal size for inspiration.  IMPRESSION: No evidence of acute cardiopulmonary disease.  Mild elevation of the right hemidiaphragm.   Electronically Signed   By: Charline Bills M.D.   On: 04/25/2014 21:39   Admission HPI: Mr. Alex Franco is a 73 yo male with PMHx of Type II DM, CKD, HTN, and HLD who presents to the ED with complaint of right groin swelling. Yesterday, pt states he had spaghetti for lunch around 1 pm and then proceed to have several episodes of non-bloody vomiting and diarrhea. The vomiting and diarrhea persisted into the night. As he was walking around that day, he felt a tender, swollen area in his right groin. He tried pressing on the area, but it did not resolve. His symptoms were also associated with chills. Over the night, the vomiting and diarrhea resolved, but today he has had this persistent, tender, right groin swelling. His girlfriend came to check on him and noticed he was weak and felt hot. She was able to convince him to come into the ED. Pt denies being around any sick contacts. He denies ever having a swelling like this before. He is sexually active with one male partner. 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 He denies any discharge, itching, dysuria, sores or suprapubic pain. Patient denies any wounds or rashes on his skin.   Hospital Course by problem list: Principal Problem:   Cellulitis of foot Active Problems:   Diabetes mellitus type 2, uncontrolled   Hyponatremia   CKD (chronic kidney disease)   Hyperlipidemia   Hypertension   Inguinal lymphadenitis   Tenderness of right calf   #Inguinal Lymphadenitis: On admission, vitals were notable for T 103.7, BP 141/66, HR 96, RR 24, O2 99% on room air. On physical exam, he had an edematous, erythematous, soft, mobile, tender lesion on right inguinal fold along with tenderness, swelling, erythema, warmth on palpation of the right foot and calf without obvious ulceration, wounds, skin defects. He was started on broad antibiotic coverage (vancomycin &  ceftriaxone). Given his exposure to cats at home, vancomycin was switched to azithromyin after considering Bartonella to be more likely than MRSA. He was discharged on azithromycin 250mg  x 2 days and cephalexin 500mg  TID x 4 days. Initial WBC of 16.9 and lactate 2.77 improved and was 8.6 and 1.0. HIV, RPR remained unremarkable while Bartonella, Toxoplasmosis, and GC/Chlamydia remained pending.  Blood cultures showed no growth after 2 days. Tenderness of his right foot and calf improved and patient was able to ambulate on discharge.    #Right Calf Tenderness: Given physical exam findings, a LE Venous Duplex was ordered to rule out DVT and was unremarkable.  #DM2: A1c during admission was 6.2, down from 14.2 a year ago. He was continued on moderate SSI and cardiac diet.   #CAD: He had an MI in 1987 and was continued on amlodipine 10mg , atorvastatin 20mg , and metoprolol 50mg  BID.  Serial EKGs showed non-focal findings.  #HTN: Continued amlodipine 10mg  and metoprolol 25mg  BID.  #Hyperlipidemia: His lipid panel was unremarkable (LDL 22, HDL 43) and  was continued on atorvastatin 20mg .   #CKD: His creatinine remained stable around 1.5.  Discharge Vitals:   BP 140/79  Pulse 98  Temp(Src) 98.4 F (36.9 C) (Oral)  Resp 16  Ht 5\' 8"  (1.727 m)  Wt 223 lb 1.6 oz (101.197 kg)  BMI 33.93 kg/m2  SpO2 97%  Discharge Labs:  Results for orders placed during the hospital encounter of 04/25/14 (from the past 24 hour(s))  GLUCOSE, CAPILLARY     Status: None   Collection Time    04/27/14 11:45 AM      Result Value Ref Range   Glucose-Capillary 97  70 - 99 mg/dL  GLUCOSE, CAPILLARY     Status: Abnormal   Collection Time    04/27/14  4:58 PM      Result Value Ref Range   Glucose-Capillary 129 (*) 70 - 99 mg/dL  GLUCOSE, CAPILLARY     Status: Abnormal   Collection Time    04/27/14  9:10 PM      Result Value Ref Range   Glucose-Capillary 123 (*) 70 - 99 mg/dL   Comment 1 Documented in Chart     Comment 2 Notify RN    CBC     Status: None   Collection Time    04/27/14 11:35 PM      Result Value Ref Range   WBC 8.6  4.0 - 10.5 K/uL   RBC 4.37  4.22 - 5.81 MIL/uL   Hemoglobin 14.0  13.0 - 17.0 g/dL   HCT 25.8  52.7 - 78.2 %   MCV 89.9  78.0 - 100.0 fL   MCH 32.0  26.0 - 34.0 pg   MCHC 35.6  30.0 - 36.0 g/dL   RDW 42.3  53.6 - 14.4 %   Platelets 186  150 - 400 K/uL  BASIC METABOLIC PANEL     Status: Abnormal   Collection Time    04/27/14 11:35 PM      Result Value Ref Range   Sodium 137  137 - 147 mEq/L   Potassium 4.2  3.7 - 5.3 mEq/L   Chloride 101  96 - 112 mEq/L   CO2 22  19 - 32 mEq/L   Glucose, Bld 120 (*) 70 - 99 mg/dL   BUN 18  6 - 23 mg/dL   Creatinine, Ser 3.15 (*) 0.50 - 1.35 mg/dL   Calcium 8.8  8.4 - 40.0 mg/dL   GFR calc  non Af Amer 46 (*) >90 mL/min   GFR calc Af Amer 54 (*) >90 mL/min   Anion gap 14  5 - 15  GLUCOSE, CAPILLARY     Status: Abnormal   Collection Time    04/28/14  7:47 AM      Result Value Ref Range   Glucose-Capillary 119 (*) 70 - 99 mg/dL    Signed: Heywood Ilesushil Tayen Narang, MD 04/28/2014, 10:39 AM     Services Ordered on Discharge: None Equipment Ordered on Discharge: None

## 2014-04-26 NOTE — ED Notes (Signed)
MD at the bedside  

## 2014-04-26 NOTE — Progress Notes (Addendum)
ANTIBIOTIC CONSULT NOTE - INITIAL  Pharmacy Consult for Vancocin and Rocephin Indication: inguinal lymphadenitis  Allergies  Allergen Reactions  . Other Anaphylaxis    To Walnuts     Patient Measurements: Weight: ~92kg  Vital Signs: Temp: 100.1 F (37.8 C) (07/31 2115) Temp src: Oral (07/31 2115) BP: 143/73 mmHg (08/01 0015) Pulse Rate: 100 (08/01 0015)  Labs:  Recent Labs  04/25/14 1807  WBC 16.9*  HGB 14.4  PLT 177  CREATININE 1.57*    Microbiology: No results found for this or any previous visit (from the past 720 hour(s)).  Medical History: Past Medical History  Diagnosis Date  . Diabetes mellitus without complication   . Hypertension   . Hypercholesterolemia     Assessment: 73yo male c/o tenderness and swelling to right groin associated w/ fever and N/V/D, exam reveals infected or inflammatory node, also w/ leukocytosis, to begin IV ABX.  Goal of Therapy:  Vancomycin trough level 10-15 mcg/ml  Plan:  Rec'd vanc 1g and Rocephin 1g IV in ED; will continue with vancomycin 1500mg  IV Q24H and Rocephin 1g IV Q24H and monitor CBC, Cx, levels prn.  Vernard GamblesVeronda Bryk, PharmD, BCPS  04/26/2014,1:09 AM  Addendum:  Clement HusbandsVanc has been dced.  No dose adjustments needed for rocephin Rx will sign off

## 2014-04-26 NOTE — H&P (Signed)
INTERNAL MEDICINE TEACHING ATTENDING NOTE  Day 1 of stay  Patient name: Alex Franco  MRN: [id number] Date of birth: 02/16/1941   Key clinical points and exam                                                           73 y.o.man with sudden onset nausea, vomiting, and fever starting 2 days back on Thursday. It resolved on Friday morning, however he developed a painful inguinal swelling. He also reports fever and chills and his in hospital temp was 103F. He denies any acute visual changes, mental or behavioral changes, any urethral discharge, any work near plants or in the garden, no travel, no recent camping trips. I have read Dr Nena Alexander HPI and I agree with their observations - no high risk behaviour noted in history. He reports no rashes. The patient has 2 cats, however does not report any scratches or bites. The patient feels subjectively better today - less pain, less fatigue.  Physical Exam  Constitutional: He is oriented to person, place, and time. No distress.  Obese, elderly, african american male  HENT:  Head: Normocephalic and atraumatic.  Mouth/Throat: Oropharynx is clear and moist. No oropharyngeal exudate.  Eyes: Conjunctivae and EOM are normal. Pupils are equal, round, and reactive to light. Right eye exhibits no discharge. Left eye exhibits no discharge. No scleral icterus.  Neck: Normal range of motion. Neck supple. No thyromegaly present.  Cardiovascular: Normal rate, regular rhythm, normal heart sounds and intact distal pulses.  Exam reveals no gallop.   No murmur heard. Pulmonary/Chest: Effort normal and breath sounds normal. He has no wheezes. He has no rales. He exhibits no tenderness.  Abdominal: Soft. Bowel sounds are normal. He exhibits no distension and no mass. There is no tenderness. There is no guarding.  Genitourinary: Penis normal. He exhibits no abnormal scrotal mass and no scrotal tenderness. Penis exhibits no lesions and no edema. No discharge found.  Inguinal  mass,right sided, about 3x5cm, soft, mobile, erythematous, tender, streaks going down his right leg upto foot. Warm to touch, no opening, no drainage.  Musculoskeletal: He exhibits tenderness (right extremity).  Right leg, streaked red on medial side from groin to foot.   Lymphadenopathy:    He has no cervical adenopathy.  Neurological: He is alert and oriented to person, place, and time. No cranial nerve deficit. Coordination normal.  Skin: Skin is warm. No rash noted. He is not diaphoretic.  Psychiatric: Memory, affect and judgment normal.    Assessment and Plan                                                                       Isolated localized tender inguinal lymphadenopathy with sepsis - My differentials would be cat scratch disease, chancroid, toxoplasmosis (although it causes generalized lymphadenopathy). There is no lymphocytosis so I would think toxoplasmosis, or infectious mononucleosis is less likely. He does not fit the picture for an individual high risk for  HIV, syphilis or STDs. Acute presentation - doubt TB. I agree with the management  by Dr Marvel Plan and Dr Ronnald Ramp with the following changes:    We will add azithromycin 580m (day1) and 2510mnext 4 days daily.   We will stop Vancomycin - MRSA does not appear to be a factor.  We will continue ceftriaxone to cover for GC, ducreyi.  I agree with right lower extremity dopplers.  We will add a bartonella and toxoplasma antibody panel.   We will add ESR, CRP.        Await blood culture report.   I have seen and evaluated this patient and discussed it with my IM resident team - Dr KeHayes Ludwignd Dr PaPosey Pronto Please see the rest of the plan per resident note from today.   BHKimballSHRoslyn/09/2013, 8:23 AM.

## 2014-04-26 NOTE — Progress Notes (Signed)
Pt arrive to unit. Pt in no s/s of distress. Pt A&Ox4. Pt oriented to room and unit. Friend at bedside. VS stable. Tele applied. Whiteboard updated. Report received from ED nurse prior to pt's arrival. Callbell within reach. Will continue to monitor.

## 2014-04-27 ENCOUNTER — Inpatient Hospital Stay (HOSPITAL_COMMUNITY): Payer: Medicare Other

## 2014-04-27 DIAGNOSIS — I889 Nonspecific lymphadenitis, unspecified: Principal | ICD-10-CM

## 2014-04-27 DIAGNOSIS — I891 Lymphangitis: Secondary | ICD-10-CM

## 2014-04-27 DIAGNOSIS — L02619 Cutaneous abscess of unspecified foot: Secondary | ICD-10-CM

## 2014-04-27 DIAGNOSIS — L03119 Cellulitis of unspecified part of limb: Secondary | ICD-10-CM | POA: Diagnosis present

## 2014-04-27 LAB — GLUCOSE, CAPILLARY
GLUCOSE-CAPILLARY: 142 mg/dL — AB (ref 70–99)
Glucose-Capillary: 97 mg/dL (ref 70–99)
Glucose-Capillary: 129 mg/dL — ABNORMAL HIGH (ref 70–99)
Glucose-Capillary: 123 mg/dL — ABNORMAL HIGH (ref 70–99)

## 2014-04-27 LAB — URINE CULTURE: Colony Count: 30000

## 2014-04-27 NOTE — Progress Notes (Signed)
VASCULAR LAB PRELIMINARY  PRELIMINARY  PRELIMINARY  PRELIMINARY  Right lower extremity venous Doppler completed.    Preliminary report:  There is no DVT or SVT noted in the right lower extremity.  Enlarged lymph node noted in the right groin.   Venera Privott, RVT 04/27/2014, 6:45 AM

## 2014-04-27 NOTE — Progress Notes (Signed)
Subjective: His right foot is more painful today but his right lymphadenitis has much improved, the red streaks in his leg have almost resolved. He is constipated but does not want medications to help him.   Objective: Vital signs in last 24 hours: Filed Vitals:   04/26/14 1325 04/26/14 1956 04/27/14 0514 04/27/14 0932  BP: 153/81 142/70 146/66 140/60  Pulse: 103 93 82 84  Temp: 98.6 F (37 C) 99.3 F (37.4 C) 98.4 F (36.9 C)   TempSrc: Oral Oral Oral   Resp: _0 Height:      Weight:      SpO2: 99% 97% 96%    Weight change:   Intake/Output Summary (Last 24 hours) at 04/27/14 1115 Last data filed at 04/27/14 6387  Gross per 24 hour  Intake    470 ml  Output    850 ml  Net   -380 ml  General: AA man, sitting in bed, in NAD  HEENT: no scleral icterus, wearing glasses Cardiac: RRR, no rubs, murmurs or gallops  Pulm: clear to auscultation bilaterally, no wheezes, rales, or rhonchi  Abd: soft, nontender, nondistended, BS present  Ext: rubbery, mobile lymph node in R inguinal area that is not tender to palpation, much smaller than yesterday; right leg red confluence has subsided.  Right dorsal foot with mild edema, tender to palpation with no fluctuance, increased warmth, or erythema.  Neuro: alert and oriented X3, moves all extremities voluntairly  Lab Results: Basic Metabolic Panel:  Recent Labs Lab 04/25/14 1807 04/26/14 0543  NA 136* 135*  K 4.4 4.0  CL 100 100  CO2 21 20  GLUCOSE 86 115*  BUN 23 23  CREATININE 1.57* 1.61*  CALCIUM 9.0 8.3*   Liver Function Tests:  Recent Labs Lab 04/25/14 1807  AST 33  ALT 23  ALKPHOS 90  BILITOT 0.5  PROT 7.1  ALBUMIN 3.7   CBC:  Recent Labs Lab 04/25/14 1807 04/26/14 0543  WBC 16.9* 15.5*  NEUTROABS 15.6*  --   HGB 14.4 12.9*  HCT 41.4 37.1*  MCV 92.8 90.3  PLT 177 188   CBG:  Recent Labs Lab 04/26/14 0216 04/26/14 0754 04/26/14 1223 04/26/14 1711 04/26/14 1957 04/27/14 0809  GLUCAP  151* 97 105* 142* 144* 142*   Hemoglobin A1C:  Recent Labs Lab 04/26/14 0543  HGBA1C 6.2*   Fasting Lipid Panel:  Recent Labs Lab 04/26/14 0543  CHOL 77  HDL 43  LDLCALC 23  TRIG 53  CHOLHDL 1.8    Urinalysis:  Recent Labs Lab 04/25/14 1828  COLORURINE YELLOW  LABSPEC 1.020  PHURINE 7.5  GLUCOSEU NEGATIVE  HGBUR TRACE*  BILIRUBINUR NEGATIVE  KETONESUR NEGATIVE  PROTEINUR 30*  UROBILINOGEN 0.2  NITRITE NEGATIVE  LEUKOCYTESUR NEGATIVE    Micro Results: Recent Results (from the past 240 hour(s))  URINE CULTURE     Status: None   Collection Time    04/25/14  6:28 PM      Result Value Ref Range Status   Specimen Description URINE, CLEAN CATCH   Final   Special Requests NONE   Final   Culture  Setup Time     Final   Value: 04/25/2014 14:47     Performed at [place]     Final   Value: 30,000 COLONIES/ML     Performed at [facility]   Culture     Final   Value: Multiple bacterial morphotypes present, none predominant.  Suggest appropriate recollection if clinically indicated.     Performed at [facility]   Report Status 04/27/2014 FINAL   Final  CULTURE, BLOOD (ROUTINE X 2)     Status: None   Collection Time    04/25/14  8:26 PM      Result Value Ref Range Status   Specimen Description BLOOD HAND LEFT   Final   Special Requests BOTTLES DRAWN AEROBIC AND ANAEROBIC 2CC   Final   Culture  Setup Time     Final   Value: 04/26/2014 01:10     Performed at [facility]   Culture     Final   Value:        BLOOD CULTURE RECEIVED NO GROWTH TO DATE CULTURE WILL BE HELD FOR 5 DAYS BEFORE ISSUING A FINAL NEGATIVE REPORT     Performed at [facility]   Report Status PENDING   Incomplete  CULTURE, BLOOD (ROUTINE X 2)     Status: None   Collection Time    04/25/14 11:53 PM      Result Value Ref Range Status   Specimen Description BLOOD LEFT HAND   Final   Special Requests     Final   Value: BOTTLES DRAWN  AEROBIC AND ANAEROBIC 2CC AEROBIC 1CC ANAEROBIC   Culture  Setup Time     Final   Value: 04/26/2014 03:43     Performed at [facility]   Culture     Final   Value:        BLOOD CULTURE RECEIVED NO GROWTH TO DATE CULTURE WILL BE HELD FOR 5 DAYS BEFORE ISSUING A FINAL NEGATIVE REPORT     Performed at [facility]   Report Status PENDING   Incomplete   Studies/Results: Ct Abdomen Pelvis W Contrast  04/25/2014   CLINICAL DATA:  Knot in the right upper thigh and groin area since Thursday. Nausea, vomiting, and diarrhea for 2 days attributed to food poisoning. Continued groin pain and right inguinal mass.  EXAM: CT ABDOMEN AND PELVIS WITH CONTRAST  TECHNIQUE: Multidetector CT imaging of the abdomen and pelvis was performed using the standard protocol following bolus administration of intravenous contrast.  CONTRAST:  28m OMNIPAQUE IOHEXOL 300 MG/ML  SOLN  COMPARISON:  None.  FINDINGS: Calcified granulomas in the lung bases. Calcified mediastinal and hilar lymph nodes. Coronary artery calcifications.  Calcified granulomas demonstrated in the liver and spleen. No other focal lesions. Small accessory spleen. The gallbladder, pancreas, adrenal glands, abdominal aorta, inferior vena cava, and retroperitoneal lymph nodes are unremarkable. Multiple cysts in both kidneys, largest on the right measuring 8 cm diameter. No solid mass or hydronephrosis. Stomach, small bowel, and colon are unremarkable. Contrast material flows to the colon suggesting no evidence of obstruction. No free air or free fluid in the abdomen.  Pelvis: The appendix is normal. Scattered diverticula in the sigmoid and descending colon without evidence of diverticulitis. No free or loculated pelvic fluid collections. Prostate gland is not enlarged. No pelvic mass or lymphadenopathy. In the right groin, there is an enlarged lymph node with a fatty hilum measuring 1.6 x 3.6 by 2.1 cm. There is inflammatory stranding in the fat around  this lymph node, consistent with inflammatory or infectious node. Additional lymph nodes in the groin regions are not enlarged. Degenerative changes in the lumbar spine. No destructive bone lesions.  IMPRESSION: No focal acute process demonstrated in the abdomen or pelvis. In the right groin, there is an enlarged  lymph node with fatty hilum and surrounding inflammatory stranding suggesting infected or inflammatory node.   Electronically Signed   By: Lucienne Capers M.D.   On: 04/25/2014 22:18   Dg Chest Port 1 View  04/25/2014   CLINICAL DATA:  Fever  EXAM: PORTABLE CHEST - 1 VIEW  COMPARISON:  04/21/2011  FINDINGS: Lungs are essentially clear. No focal consolidation. No pleural effusion or pneumothorax.  Elevation of right hemidiaphragm.  The heart is top-normal size for inspiration.  IMPRESSION: No evidence of acute cardiopulmonary disease.  Mild elevation of the right hemidiaphragm.   Electronically Signed   By: Julian Hy M.D.   On: 04/25/2014 21:39   Medications: I have reviewed the patient's current medications. Scheduled Meds: . amLODipine  10 mg Oral Daily  . aspirin  81 mg Oral Daily  . atorvastatin  20 mg Oral Daily  . azithromycin  250 mg Oral Daily  . cefTRIAXone (ROCEPHIN)  IV  1 g Intravenous Q24H  . heparin  5,000 Units Subcutaneous 3 times per day  . insulin aspart  0-15 Units Subcutaneous TID WC  . metoprolol tartrate  25 mg Oral BID  . multivitamin with minerals  1 tablet Oral Daily  . sodium chloride  3 mL Intravenous Q12H   Continuous Infusions:  PRN Meds:.acetaminophen, acetaminophen, ondansetron (ZOFRAN) IV, ondansetron Assessment/Plan: 73 year old male with insulin-dependent DM2, HTN, HLD, CKD, CAD (h/o MI), h/o CVA who presents with inguinal lymphadenitis and R calf tenderness.  Inguinal Lymphadenitis due to Infection: Infection is suspected given acute onset, fever, WBC 16.9->15.5 after abx. Necrotizing fasciitis less likely given slow spread of infection. He  has right dorsal foot tenderness and swelling with no trauma, developing cellulitis/osteomyelitis is possible. Bartonella infection is very possible given that he has cats. Toxoplasmosis possible but less likely given his immunocompetence and the focal nature of his lymphadenitis. STDs possible but less likely given that he's been active with only one partner and absence of related symptoms. HIV and RPR non-reactive.  ESR and CRP elevated, as expected.  -Discontinued Vancomycin on 8/1 -Started  azithromycin on 8/1 (548m x 1 day + 2573mx 4 days) to cover for Bartonella - Bartonella Ab, Toxo Ab, GC/Chlamydia pending.  -Check ESR & CRP  -Follow blood cultures  -Right foot Xray  -CBC in am  Right Calf Tenderness: DVT suspected given findings on admission of erythema, warmth, pain, but symptoms had improved, and Lower extremity Doppler is negative for DVT or SVT.  -Continue heparin for DVT prophylaxis   CAD (h/o MI): MI in 1987. Stable EKG findings reassuring.  -Continue telemetry  -Continue amlodipine 1066matorvastatin 30m31mnd metoprolol 25mg49m.   HTN: BP of 140/60 this morning.   -Continue amlodipine, metoprolol   HLD: LDL 23 which is at goal for his risk factors, including DM, CAD, h/o CVA.  -Continue atorvastatin   Insulin-dependent DM2: HgA1c 14.2 in 01/03/13, but improved to 6.2%.   -Check CBGs QID  -Continue moderate SSI &  Carb mod diet   CKD: Crt 1.6, close to his baseline of 1.4.  Stable.  -BMET in am  Diet: Carb mod diet  DVT: heparin 5000 [place] units TID   Dispo: Disposition is deferred at this time, awaiting improvement of current medical problems. Anticipated discharge in approximately 1-2 day(s).   [facility]   The patient does not have transportation limitations that  hinder transportation to clinic appointments.  .Services Needed at time of discharge:  Y = Yes, Blank = No  PT:    OT:    RN:    Equipment:    Other:        LOS: 2 days   Blain Pais, MD 04/27/2014, 11:15 AM

## 2014-04-27 NOTE — Progress Notes (Signed)
  PROGRESS NOTE MEDICINE TEACHING ATTENDING   Day 2 of stay Patient name: Ignatz Deis   Medical record number: [id number] Date of birth: May 29, 1941    Met with Mr Duley. He reports less pain in his right leg, and is able to stand on it which he wasn't able to do yesterday. Per my resident his right foot was more painful, however the patient did not report that to me. He has no new complaints and feels better.   On exam, he is alert and oriented, with no acute distress. His heart rate and rhythm are normal, with no murmurs. Local exam - minimal tenderness in groin swelling, decreased size, streaking down the leg decreased and right leg less tender to palpate. Pulses peripheral present. No edema seen in legs.   Assessment and Plan                                                                       I think we can continue his current treatment, however I would like to consult ID to see if I have not missed something here. The patient seems to be improving clinically, I await today's CBC results. He does not have a DVT. Dr Hayes Ludwig ordered Xray of right foot, we will await result. Patient can bear weight without much difficulty, and requires minimal assistance on his feet. Once we have reviewed CBC results, and ID has seen the patient, he might be able to be discharged with appropriate follow up today, if ID agrees with plan.    I have discussed the care of this patient with my IM team residents. Please see the resident note for details.  Leonard, [place] 04/27/2014, 1:04 PM.

## 2014-04-27 NOTE — Consult Note (Signed)
[facility]    Date of Admission:  04/25/2014   One dose of vancomycin 04/25/2014        Day 3 ceftriaxone        Day 2 azithromycin               Reason for Consult: Right foot cellulitis complicated by a right inguinal adenitis    Referring Physician: Dr. Aletta EdouardShilpa Bhardwaj   Principal Problem:   Cellulitis of foot Active Problems:   Inguinal lymphadenitis   Diabetes mellitus type 2, uncontrolled   Hyponatremia   CKD (chronic kidney disease)   Hyperlipidemia   Hypertension   Tenderness of right calf   . amLODipine  10 mg Oral Daily  . aspirin  81 mg Oral Daily  . atorvastatin  20 mg Oral Daily  . azithromycin  250 mg Oral Daily  . cefTRIAXone (ROCEPHIN)  IV  1 g Intravenous Q24H  . heparin  5,000 Units Subcutaneous 3 times per day  . insulin aspart  0-15 Units Subcutaneous TID WC  . metoprolol tartrate  25 mg Oral BID  . multivitamin with minerals  1 tablet Oral Daily  . sodium chloride  3 mL Intravenous Q12H    Recommendations: 1. Agree with current antibiotic therapy; consider changing ceftriaxone to oral cephalexin tomorrow and treat for 7 more days 2. Continue oral azithromycin 3 more days   Assessment: I suspect that he has cellulitis due to strep or staph causing reactive inguinal lymphadenitis and agree with continuing ceftriaxone then transitioning to cephalexin tomorrow if he continues to do well. Bartonella infection causing cat scratch infection is much less common, especially without an obvious inoculation site or recent exposure to fleas. However, I agree with empiric therapy with azithromycin pending Bartonella serology results. I suspect that he can safely be discharged in the morning.    HPI: Alex Franco is a 73 y.o. male who was in his usual state of health until 4 days ago when he had sudden onset of nausea, vomiting and diarrhea. He also had shaking chills. He then began to notice some tenderness and swelling in his  right groin followed by progressive swelling, redness and pain in his right foot leading to admission 3 days ago. He had temperature of 103.7 on admission. He was given one dose of vancomycin and started on ceftriaxone. History that he has 2 cats was obtained he was started on empiric therapy for Bartonella for possible cat scratch disease. He denies having any scratches or bites from his cats. His cats are strictly indoors and have not had any fleas. He denies having any recent injury to his right foot.   Review of Systems: Pertinent items are noted in HPI.  Past Medical History  Diagnosis Date  . Diabetes mellitus without complication   . Hypertension   . Hypercholesterolemia     History  Substance Use Topics  . Smoking status: Never Smoker   . Smokeless tobacco: Not on file  . Alcohol Use: No    History reviewed. No pertinent family history. Allergies  Allergen Reactions  . Other Anaphylaxis    To Walnuts     OBJECTIVE: Blood pressure 137/82, pulse 95, temperature 97.9 F (36.6 C), temperature source Oral, resp. rate 16, height 5\' 8"  (1.727 m), weight 101.197 kg (223 lb 1.6 oz), SpO2 99.00%. General: He is in good spirits sitting on the side of his bed. He states he is feeling  much better and eager to go home. Skin: No generalized rash Lungs: Clear Cor: Regular S1-S2 no murmurs Abdomen: Soft and nontender. He has a small, mildly tender lymph node palpable in the right groin without obvious fluctuance or overlying cellulitis Joints and extremities: Diffuse swelling and redness over the dorsum of his right foot. It is mildly tender to palpation. There is no fluctuance  Lab Results Lab Results  Component Value Date   WBC 15.5* 04/26/2014   HGB 12.9* 04/26/2014   HCT 37.1* 04/26/2014   MCV 90.3 04/26/2014   PLT 188 04/26/2014    Lab Results  Component Value Date   CREATININE 1.61* 04/26/2014   BUN 23 04/26/2014   NA 135* 04/26/2014   K 4.0 04/26/2014   CL 100 04/26/2014   CO2 20  04/26/2014    Lab Results  Component Value Date   ALT 23 04/25/2014   AST 33 04/25/2014   ALKPHOS 90 04/25/2014   BILITOT 0.5 04/25/2014     Microbiology: Recent Results (from the past 240 hour(s))  URINE CULTURE     Status: None   Collection Time    04/25/14  6:28 PM      Result Value Ref Range Status   Specimen Description URINE, CLEAN CATCH   Final   Special Requests NONE   Final   Culture  Setup Time     Final   Value: 04/25/2014 14:47     Performed at [facility] Count     Final   Value: 30,000 COLONIES/ML     Performed at [facility]   Culture     Final   Value: Multiple bacterial morphotypes present, none predominant. Suggest appropriate recollection if clinically indicated.     Performed at [facility]   Report Status 04/27/2014 FINAL   Final  CULTURE, BLOOD (ROUTINE X 2)     Status: None   Collection Time    04/25/14  8:26 PM      Result Value Ref Range Status   Specimen Description BLOOD HAND LEFT   Final   Special Requests BOTTLES DRAWN AEROBIC AND ANAEROBIC 2CC   Final   Culture  Setup Time     Final   Value: 04/26/2014 01:10     Performed at [facility]   Culture     Final   Value:        BLOOD CULTURE RECEIVED NO GROWTH TO DATE CULTURE WILL BE HELD FOR 5 DAYS BEFORE ISSUING A FINAL NEGATIVE REPORT     Performed at [facility]   Report Status PENDING   Incomplete  CULTURE, BLOOD (ROUTINE X 2)     Status: None   Collection Time    04/25/14 11:53 PM      Result Value Ref Range Status   Specimen Description BLOOD LEFT HAND   Final   Special Requests     Final   Value: BOTTLES DRAWN AEROBIC AND ANAEROBIC 2CC AEROBIC 1CC ANAEROBIC   Culture  Setup Time     Final   Value: 04/26/2014 03:43     Performed at [facility]   Culture     Final   Value:        BLOOD CULTURE RECEIVED NO GROWTH TO DATE CULTURE WILL BE HELD FOR 5 DAYS BEFORE ISSUING A FINAL NEGATIVE REPORT     Performed at [facility]     Report Status PENDING   Incomplete  Cliffton Asters, MD [facility] [phone] pager   [phone] cell 04/27/2014, 2:50 PM

## 2014-04-28 DIAGNOSIS — E119 Type 2 diabetes mellitus without complications: Secondary | ICD-10-CM

## 2014-04-28 DIAGNOSIS — M79609 Pain in unspecified limb: Secondary | ICD-10-CM

## 2014-04-28 DIAGNOSIS — N189 Chronic kidney disease, unspecified: Secondary | ICD-10-CM

## 2014-04-28 DIAGNOSIS — I129 Hypertensive chronic kidney disease with stage 1 through stage 4 chronic kidney disease, or unspecified chronic kidney disease: Secondary | ICD-10-CM

## 2014-04-28 DIAGNOSIS — E785 Hyperlipidemia, unspecified: Secondary | ICD-10-CM

## 2014-04-28 DIAGNOSIS — I252 Old myocardial infarction: Secondary | ICD-10-CM

## 2014-04-28 DIAGNOSIS — I2510 Atherosclerotic heart disease of native coronary artery without angina pectoris: Secondary | ICD-10-CM

## 2014-04-28 LAB — CBC
HCT: 39.3 % (ref 39.0–52.0)
Hemoglobin: 14 g/dL (ref 13.0–17.0)
MCH: 32 pg (ref 26.0–34.0)
MCHC: 35.6 g/dL (ref 30.0–36.0)
MCV: 89.9 fL (ref 78.0–100.0)
PLATELETS: 186 10*3/uL (ref 150–400)
RBC: 4.37 MIL/uL (ref 4.22–5.81)
RDW: 14.2 % (ref 11.5–15.5)
WBC: 8.6 10*3/uL (ref 4.0–10.5)

## 2014-04-28 LAB — QUANTIFERON TB GOLD ASSAY (BLOOD)
Interferon Gamma Release Assay: NEGATIVE
Mitogen value: 1.26 IU/mL
Quantiferon Nil Value: 0.18 IU/mL
TB Ag value: 0.34 IU/mL
TB Antigen Minus Nil Value: 0.16 IU/mL

## 2014-04-28 LAB — BASIC METABOLIC PANEL
ANION GAP: 14 (ref 5–15)
BUN: 18 mg/dL (ref 6–23)
CALCIUM: 8.8 mg/dL (ref 8.4–10.5)
CO2: 22 mEq/L (ref 19–32)
CREATININE: 1.43 mg/dL — AB (ref 0.50–1.35)
Chloride: 101 mEq/L (ref 96–112)
GFR, EST AFRICAN AMERICAN: 54 mL/min — AB (ref >90)
GFR, EST NON AFRICAN AMERICAN: 46 mL/min — AB (ref >90)
Glucose, Bld: 120 mg/dL — ABNORMAL HIGH (ref 70–99)
Potassium: 4.2 mEq/L (ref 3.7–5.3)
SODIUM: 137 meq/L (ref 137–147)

## 2014-04-28 LAB — GLUCOSE, CAPILLARY: Glucose-Capillary: 119 mg/dL — ABNORMAL HIGH (ref 70–99)

## 2014-04-28 MED ORDER — AZITHROMYCIN 250 MG PO TABS
250.0000 mg | ORAL_TABLET | Freq: Every day | ORAL | Status: DC
Start: 2014-04-28 — End: 2019-08-29

## 2014-04-28 MED ORDER — CEPHALEXIN 500 MG PO CAPS: 500.0000 mg | ORAL_CAPSULE | Freq: Three times a day (TID) | ORAL | Status: DC

## 2014-04-28 NOTE — Discharge Summary (Signed)
I have seen, examined, and discussed & agree with discharge plan as outline by  Dr. Allena KatzPatel. 30 min or less has been done to coordinate hospital discharge.  Duke Salviaynthia B. Drue SecondSnider MD MPH [facility] [phone]

## 2014-04-28 NOTE — Progress Notes (Signed)
Cher Odonnell to be D/C'd Home per MD order.  Discussed with the patient and all questions fully answered.    Medication List         amLODipine 10 MG tablet  Commonly known as:  NORVASC  Take 10 mg by mouth daily.     aspirin 81 MG chewable tablet  Chew 81 mg by mouth daily.     atorvastatin 40 MG tablet  Commonly known as:  LIPITOR  Take 20 mg by mouth daily.     azithromycin 250 MG tablet  Commonly known as:  ZITHROMAX  Take 1 tablet (250 mg total) by mouth daily.     cephALEXin 500 MG capsule  Commonly known as:  KEFLEX  Take 1 capsule (500 mg total) by mouth 3 (three) times daily.     insulin glargine 100 UNIT/ML injection  Commonly known as:  LANTUS  Inject 0.3 mLs (30 Units total) into the skin at bedtime.     metoprolol tartrate 25 MG tablet  Commonly known as:  LOPRESSOR  Take 25 mg by mouth 2 (two) times daily.     multivitamin with minerals Tabs tablet  Take 1 tablet by mouth daily.     NOVOLIN R IJ  Inject 10 Units as directed daily as needed. Take only when sugar is high per patient        VVS, Skin clean, dry and intact without evidence of skin break down, no evidence of skin tears noted. IV catheter discontinued intact. Site without signs and symptoms of complications. Dressing and pressure applied.  An After Visit Summary was printed and given to the patient.  D/c education completed with patient/family including follow up instructions, medication list, d/c activities limitations if indicated, with other d/c instructions as indicated by MD - patient able to verbalize understanding, all questions fully answered.   Patient instructed to return to ED, call 911, or call MD for any changes in condition.   Patient escorted via WC, and D/C home via private auto.  Aldean AstLEsperance, Harly Pipkins C 04/28/2014 10:38 AM

## 2014-04-28 NOTE — Progress Notes (Signed)
Subjective: No acute events overnight.   This morning, he reports feeling well and is ready to go home. He feels he suffered a bout of food poisoning after eating leftover spaghetti. His significant other was present at bedside as well.   Objective: Vital signs in last 24 hours: Filed Vitals:   04/27/14 1304 04/27/14 2109 04/28/14 0419 04/28/14 0945  BP: 137/82 155/81 131/71 140/79  Pulse: 95 90 86 98  Temp: 97.9 F (36.6 C) 98 F (36.7 C) 98.4 F (36.9 C)   TempSrc: Oral Oral Oral   Resp: 16 18 16    Height:      Weight:      SpO2: 99% 98% 97%    Weight change:   Intake/Output Summary (Last 24 hours) at 04/28/14 1001 Last data filed at 04/28/14 0957  Gross per 24 hour  Intake    820 ml  Output    300 ml  Net    520 ml   General: resting in bed, NAD HEENT: PERRL, EOMI, no scleral icterus Cardiac: RRR, no rubs, murmurs or gallops Pulm: clear to auscultation bilaterally, no wheezes, rales, or rhonchi Abd: soft, nontender, nondistended, BS present Ext: Erythema minimally visible along the posterior medial aspect of the right calf. Swelling and minor tenderness to palpation present along the right dorsum of the foot. Right inguinal lymphadenopathy difficult to palpate. Neuro: alert and oriented X3, cranial nerves II-XII grossly intact, strength and sensation to light touch equal in bilateral upper and lower extremities  Lab Results: Basic Metabolic Panel:  Recent Labs Lab 04/26/14 0543 04/27/14 2335  NA 135* 137  K 4.0 4.2  CL 100 101  CO2 20 22  GLUCOSE 115* 120*  BUN 23 18  CREATININE 1.61* 1.43*  CALCIUM 8.3* 8.8   CBC:  Recent Labs Lab 04/25/14 1807 04/26/14 0543 04/27/14 2335  WBC 16.9* 15.5* 8.6  NEUTROABS 15.6*  --   --   HGB 14.4 12.9* 14.0  HCT 41.4 37.1* 39.3  MCV 92.8 90.3 89.9  PLT 177 188 186    Recent Labs Lab 04/26/14 1957 04/27/14 0809 04/27/14 1145 04/27/14 1658 04/27/14 2110 04/28/14 0747  GLUCAP 144* 142* 97 129* 123* 119*    Micro Results: Recent Results (from the past 240 hour(s))  URINE CULTURE     Status: None   Collection Time    04/25/14  6:28 PM      Result Value Ref Range Status   Specimen Description URINE, CLEAN CATCH   Final   Special Requests NONE   Final   Culture  Setup Time     Final   Value: 04/25/2014 14:47     Performed at [facility] Count     Final   Value: 30,000 COLONIES/ML     Performed at [facility]   Culture     Final   Value: Multiple bacterial morphotypes present, none predominant. Suggest appropriate recollection if clinically indicated.     Performed at [facility]   Report Status 04/27/2014 FINAL   Final  CULTURE, BLOOD (ROUTINE X 2)     Status: None   Collection Time    04/25/14  8:26 PM      Result Value Ref Range Status   Specimen Description BLOOD HAND LEFT   Final   Special Requests BOTTLES DRAWN AEROBIC AND ANAEROBIC 2CC   Final   Culture  Setup Time     Final   Value: 04/26/2014 01:10  Performed at [facility]   Culture     Final   Value:        BLOOD CULTURE RECEIVED NO GROWTH TO DATE CULTURE WILL BE HELD FOR 5 DAYS BEFORE ISSUING A FINAL NEGATIVE REPORT     Performed at [facility]   Report Status PENDING   Incomplete  CULTURE, BLOOD (ROUTINE X 2)     Status: None   Collection Time    04/25/14 11:53 PM      Result Value Ref Range Status   Specimen Description BLOOD LEFT HAND   Final   Special Requests     Final   Value: BOTTLES DRAWN AEROBIC AND ANAEROBIC 2CC AEROBIC 1CC ANAEROBIC   Culture  Setup Time     Final   Value: 04/26/2014 03:43     Performed at [facility]   Culture     Final   Value:        BLOOD CULTURE RECEIVED NO GROWTH TO DATE CULTURE WILL BE HELD FOR 5 DAYS BEFORE ISSUING A FINAL NEGATIVE REPORT     Performed at [facility]   Report Status PENDING   Incomplete   Studies/Results: Dg Foot Complete Right  04/27/2014   CLINICAL DATA:  Dorsal foot swelling and  effusion, tender to palpation  EXAM: RIGHT FOOT COMPLETE - 3+ VIEW  COMPARISON:  None.  FINDINGS: No acute fracture or malalignment. 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 Soft tissue swelling over the dorsum of the forefoot. Atherosclerotic calcifications noted in the posterior tibial and dorsalis pedis arteries. Normal bony mineralization. No lytic or blastic osseous lesion.  IMPRESSION: 1. No acute osseous abnormality. 2. Great toe MTP joint osteoarthritis. 3. Soft tissue swelling over the dorsum of the foot. 4. Atherosclerotic calcification noted in the runoff vessels.   Electronically Signed   By: Malachy MoanHeath  McCullough M.D.   On: 04/27/2014 14:33   Medications: I have reviewed the patient's current medications. Scheduled Meds: . amLODipine  10 mg Oral Daily  . aspirin  81 mg Oral Daily  . atorvastatin  20 mg Oral Daily  . azithromycin  250 mg Oral Daily  . cefTRIAXone (ROCEPHIN)  IV  1 g Intravenous Q24H  . heparin  5,000 Units Subcutaneous 3 times per day  . insulin aspart  0-15 Units Subcutaneous TID WC  . metoprolol tartrate  25 mg Oral BID  . multivitamin with minerals  1 tablet Oral Daily  . sodium chloride  3 mL Intravenous Q12H   Continuous Infusions:  PRN Meds:.acetaminophen, acetaminophen, ondansetron (ZOFRAN) IV, ondansetron Assessment/Plan: Principal Problem:   Cellulitis of foot Active Problems:   Diabetes mellitus type 2, uncontrolled   Hyponatremia   CKD (chronic kidney disease)   Hyperlipidemia   Hypertension   Inguinal lymphadenitis   Tenderness of right calf  73 year old male with insulin-dependent DM2, HTN, HLD, CKD, CAD (h/o MI), h/o CVA who presents with inguinal lymphadenitis and R calf tenderness likely 2/2 infection stable for discharge today.   Inguinal Lymphadenitis 2/2 Infection: Infection is suspected given acute onset, fever, WBC 8.6 today (16.9 on admission) after abx. Cellulitis is suspected given that other possibilities have largely  been excluded though Bartonella is possible given that he has cats though less likely given age of cats. Osteomyelitis less likely given absence of findings of R foot X ray. -Transition ceftriaxone to cephalexin 500mg  TID given CKD -Continue azithromycin 250mg  x 2 days to cover for Bartonella  -Bartonella Ab, Toxo  Ab pending. GC/Chlamydia pending  Right Calf Tenderness: DVT suspected given findings on admission of erythema, warmth, pain, but symptoms had improved, and Duplex was unremarkable. -Continue heparin for DVT prophylaxis   CAD (h/o MI): MI in 1987. Stable EKG findings reassuring.  -Continue telemetry  -Continue amlodipine 10mg , atorvastatin 20mg , and metoprolol 25mg  BID.   HTN: BP of 140/60 this morning.  -Continue amlodipine, metoprolol   HLD: LDL 23 which is at goal for his risk factors, including DM, CAD, h/o CVA.  -Continue atorvastatin   Insulin-dependent DM2: HgA1c 14.2 in 01/03/13, but improved to 6.2%.  -Check CBGs QID  -Continue moderate SSI & Carb mod diet   CKD: Crt 1.4 today, close to his baseline. Stable.   Diet: Carb mod diet   DVT: heparin 5000 [place] units TID   Dispo: Disposition is deferred at this time, awaiting improvement of current medical problems.  Anticipated discharge in approximately 1 day(s).   The patient does have a current PCP (Provider Not In System) and does not need an [facility] hospital follow-up appointment after discharge.  The patient does have transportation limitations that hinder transportation to clinic appointments.  .Services Needed at time of discharge: Y = Yes, Blank = No PT:   OT:   RN:   Equipment:   Other:     LOS: 3 days   Heywood Iles, MD 04/28/2014, 10:01 AM

## 2014-04-28 NOTE — Care Management Note (Signed)
    Page 1 of 1   04/28/2014     4:33:37 PM CARE MANAGEMENT NOTE 04/28/2014  Patient:  Alex Franco,Alex Franco   Account Number:  [id number]  Date Initiated:  04/28/2014  Documentation initiated by:  Letha CapeAYLOR,Ugonna Keirsey  Subjective/Objective Assessment:   dx cellulitis of foot  admit- lives alone.     Action/Plan:   Anticipated DC Date:  04/28/2014   Anticipated DC Plan:  HOME/SELF CARE      DC Planning Services  CM consult      Choice offered to / List presented to:             Status of service:  Completed, signed off Medicare Important Message given?  YES (If response is "NO", the following Medicare IM given date fields will be blank) Date Medicare IM given:  04/28/2014 Medicare IM given by:  Letha CapeAYLOR,Youcef Klas Date Additional Medicare IM given:   Additional Medicare IM given by:    Discharge Disposition:  HOME/SELF CARE  Per UR Regulation:  Reviewed for med. necessity/level of care/duration of stay  If discussed at Long Length of Stay Meetings, dates discussed:    Comments:

## 2014-04-28 NOTE — Discharge Instructions (Signed)
It was pleasure taking care of you. You were hospitalized for cellulitis & lymphadenitis. It is very important to CONTINUE antibiotics and follow-up with your doctor in 1 week for a re-check.   If you feel your symptoms are getting worse, please come to the ED.   Thank you!   Cellulitis Cellulitis is an infection of the skin and the tissue beneath it. The infected area is usually red and tender. Cellulitis occurs most often in the arms and lower legs.  CAUSES  Cellulitis is caused by bacteria that enter the skin through cracks or cuts in the skin. The most common types of bacteria that cause cellulitis are staphylococci and streptococci. SIGNS AND SYMPTOMS   Redness and warmth.  Swelling.  Tenderness or pain.  Fever. DIAGNOSIS  Your health care provider can usually determine what is wrong based on a physical exam. Blood tests may also be done. TREATMENT  Treatment usually involves taking an antibiotic medicine. HOME CARE INSTRUCTIONS   Take your antibiotic medicine as directed by your health care provider. Finish the antibiotic even if you start to feel better.  Keep the infected arm or leg elevated to reduce swelling.  Apply a warm cloth to the affected area up to 4 times per day to relieve pain.  Take medicines only as directed by your health care provider.  Keep all follow-up visits as directed by your health care provider. SEEK MEDICAL CARE IF:   You notice red streaks coming from the infected area.  Your red area gets larger or turns dark in color.  Your bone or joint underneath the infected area becomes painful after the skin has healed.  Your infection returns in the same area or another area.  You notice a swollen bump in the infected area.  You develop new symptoms.  You have a fever. SEEK IMMEDIATE MEDICAL CARE IF:   You feel very sleepy.  You develop vomiting or diarrhea.  You have a general ill feeling (malaise) with muscle aches and pains. MAKE  SURE YOU:   Understand these instructions.  Will watch your condition.  Will get help right away if you are not doing well or get worse. Document Released: 06/22/2005 Document Revised: 01/27/2014 Document Reviewed: 11/28/2011 [facility] Patient Information 2015 [employer] [place]. This information is not intended to replace advice given to you by your health care provider. Make sure you discuss any questions you have with your health care provider.

## 2014-04-29 LAB — TOXOPLASMA ANTIBODIES- IGG AND  IGM
TOXOPLASMA ANTIBODY- IGM: 12.2 [IU]/mL — AB (ref <8.0)
Toxoplasma IgG Ratio: 22.5 IU/mL — ABNORMAL HIGH (ref <7.2)

## 2014-04-29 LAB — GC/CHLAMYDIA PROBE AMP
CT PROBE, AMP APTIMA: NEGATIVE
GC PROBE AMP APTIMA: NEGATIVE

## 2014-05-01 LAB — BARTONELLA ANTIBODY PANEL: B henselae IgM: NEGATIVE

## 2014-05-01 LAB — BARTONELLA ANITBODY PANEL: B HENSELAE IGG: NEGATIVE

## 2014-05-02 LAB — CULTURE, BLOOD (ROUTINE X 2)
Culture: NO GROWTH
Culture: NO GROWTH

## 2016-02-07 IMAGING — CT CT ABD-PELV W/ CM
2 of 5 series · 15 of 46 positions shown, 17 images · IV contrast (APPLIED)
Comparison: None.

CLINICAL DATA: Knot in the right upper thigh and groin area since
[REDACTED]. Nausea, vomiting, and diarrhea for 2 days attributed to
food poisoning. Continued groin pain and right inguinal mass.

EXAM:
CT ABDOMEN AND PELVIS WITH CONTRAST
TECHNIQUE: Multidetector CT imaging of the abdomen and pelvis was performed
using the standard protocol following bolus administration of
intravenous contrast.
CONTRAST:  80mL OMNIPAQUE IOHEXOL 300 MG/ML  SOLN

[Series 2: abd/ pelvis 5.0 i30f 1 · axial · 0.78mm/px · z∈[-407,+58]mm · 12 of 105 slices shown, 14 images]
[im 6/105  soft-tissue]
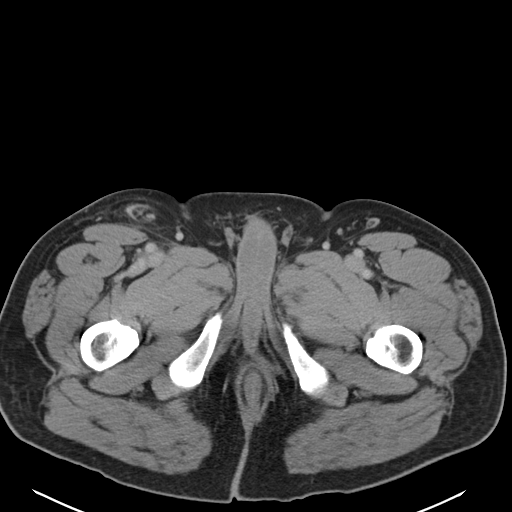
[im 6/105  bone]
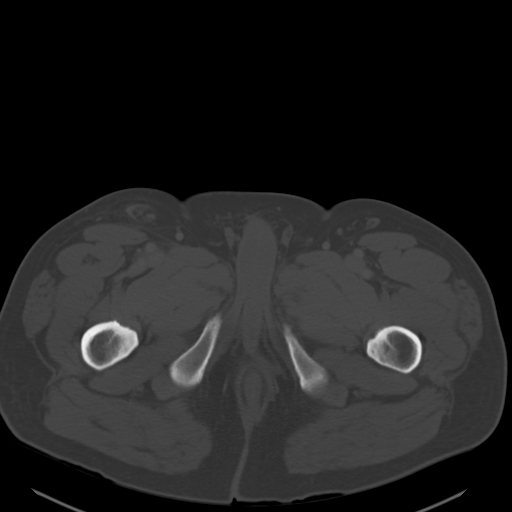
[im 18/105  soft-tissue]
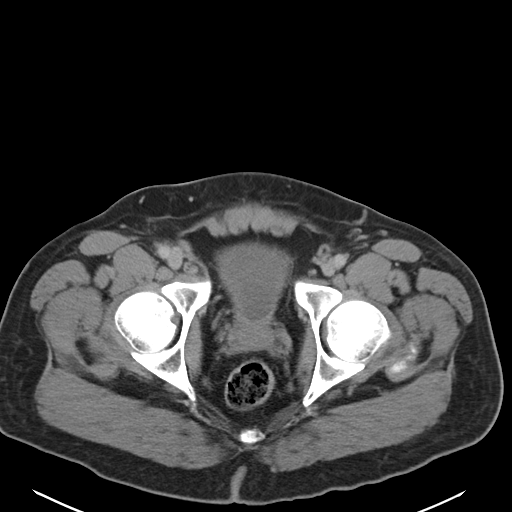
[im 24/105  soft-tissue]
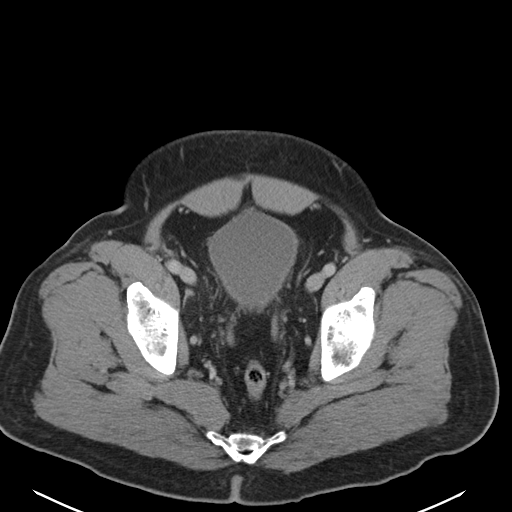
[im 29/105  soft-tissue]
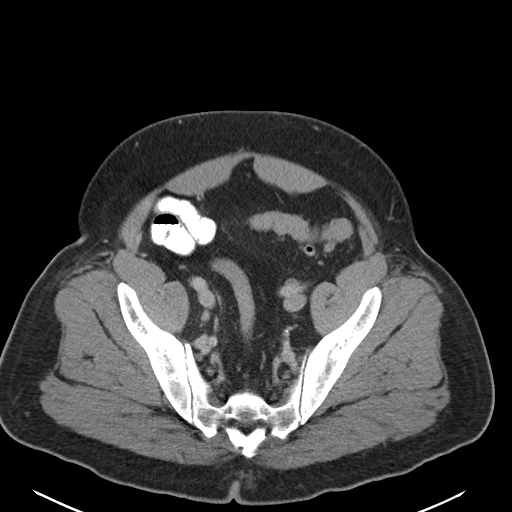
[im 41/105  soft-tissue]
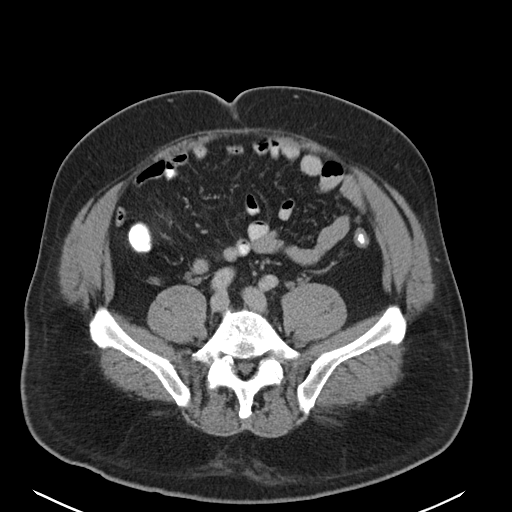
[im 47/105  soft-tissue]
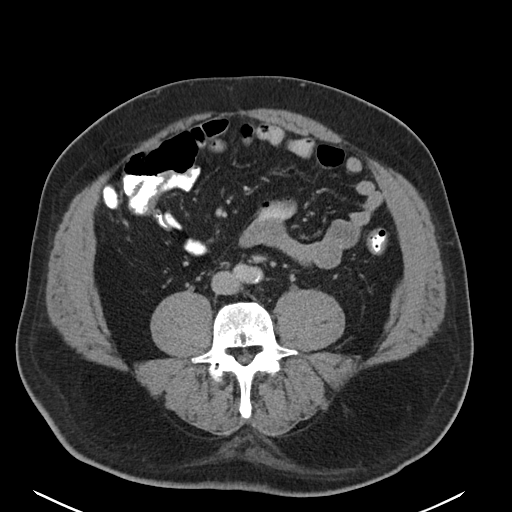
[im 58/105  soft-tissue]
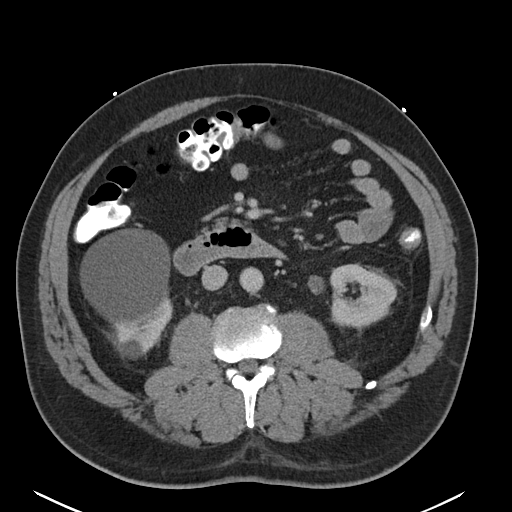
[im 64/105  soft-tissue]
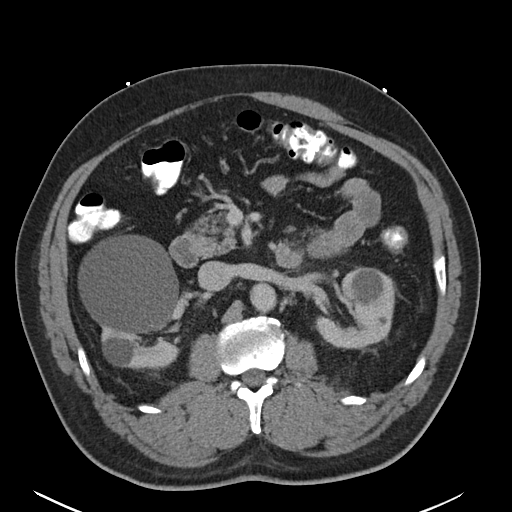
[im 76/105  soft-tissue]
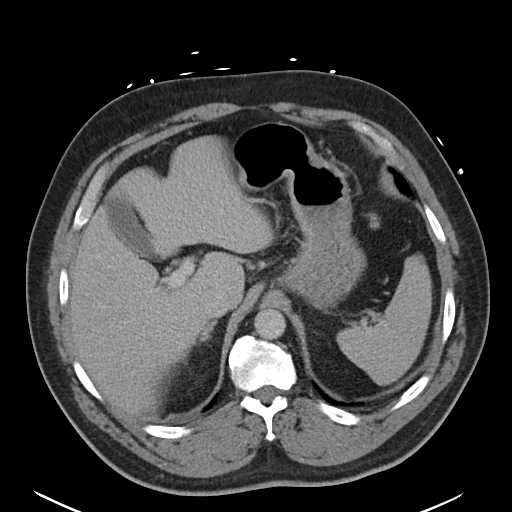
[im 76/105  bone]
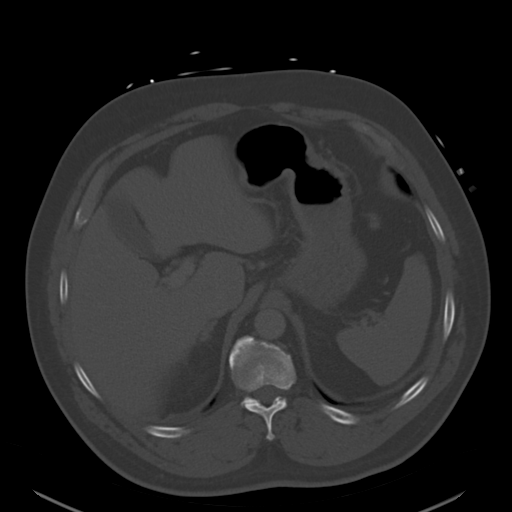
[im 81/105  soft-tissue]
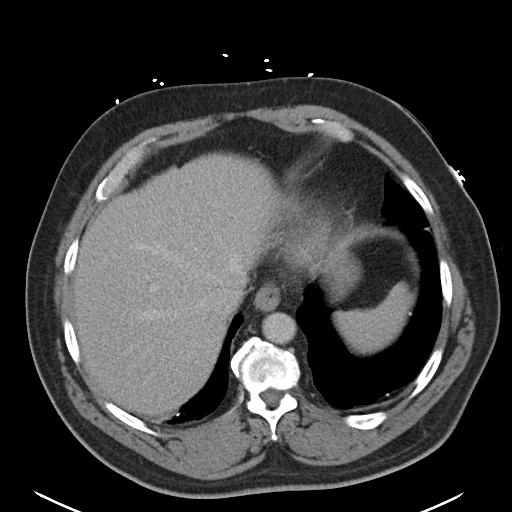
[im 87/105  soft-tissue]
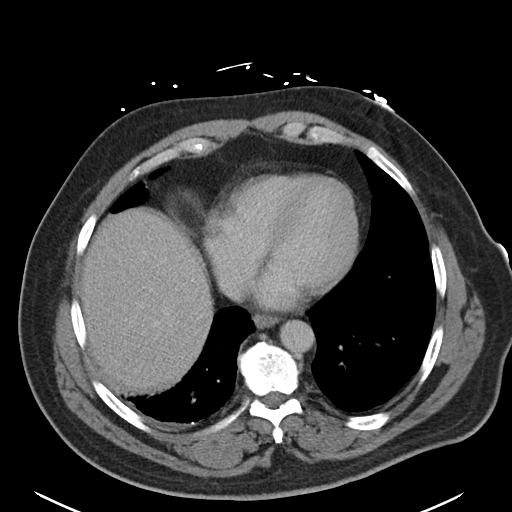
[im 99/105  soft-tissue]
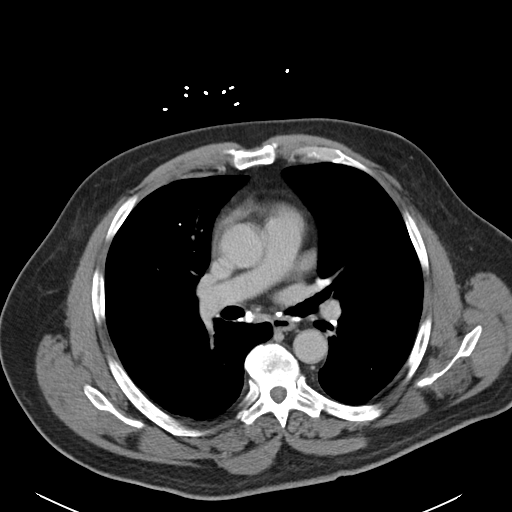

[Series 5: coronal soft tissue · coronal · 0.81mm/px · 3 of 96 slices shown]
[im 32/96  soft-tissue]
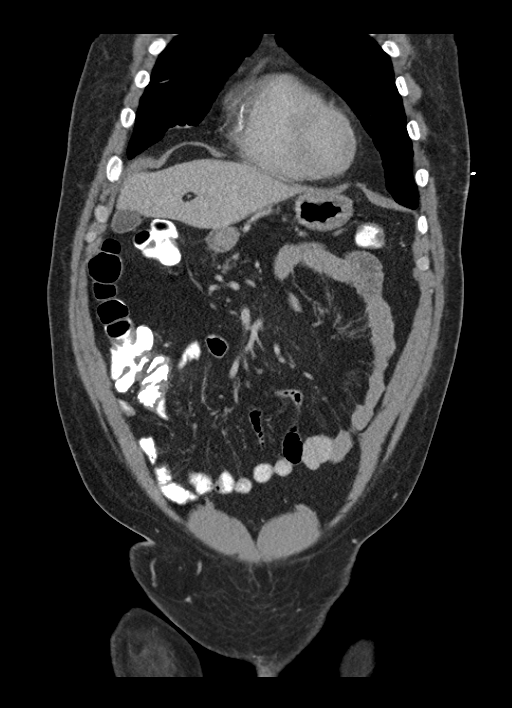
[im 43/96  soft-tissue]
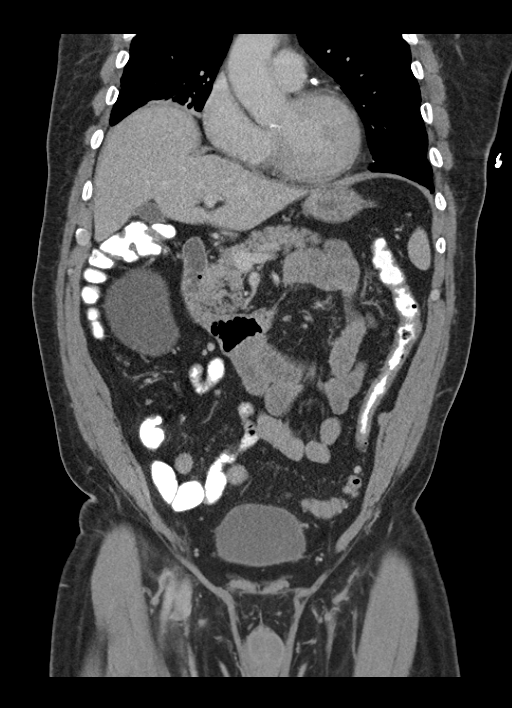
[im 53/96  soft-tissue]
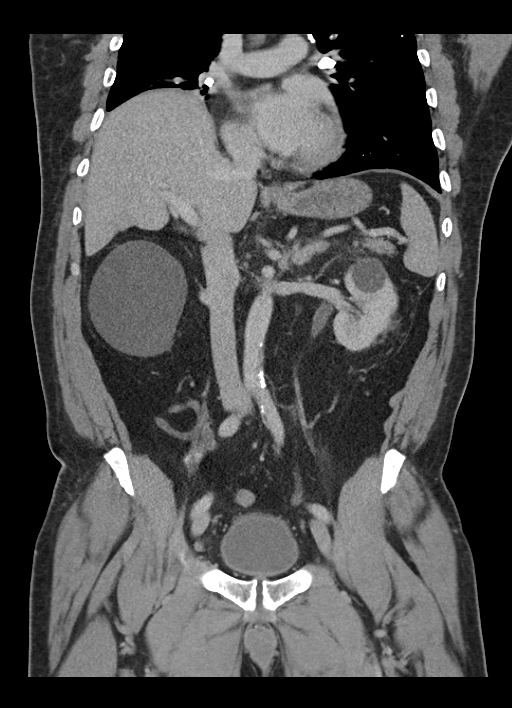

[15 of 46 positions shown; findings below may reference images not displayed]

FINDINGS: Calcified granulomas in the lung bases. Calcified mediastinal and
hilar lymph nodes. Coronary artery calcifications.

Calcified granulomas demonstrated in the liver and spleen. No other
focal lesions. Small accessory spleen. The gallbladder, pancreas,
adrenal glands, abdominal aorta, inferior vena cava, and
retroperitoneal lymph nodes are unremarkable. Multiple cysts in both
kidneys, largest on the right measuring 8 cm diameter. No solid mass
or hydronephrosis. Stomach, small bowel, and colon are unremarkable.
Contrast material flows to the colon suggesting no evidence of
obstruction. No free air or free fluid in the abdomen.

Pelvis: The appendix is normal. Scattered diverticula in the sigmoid
and descending colon without evidence of diverticulitis. No free or
loculated pelvic fluid collections. Prostate gland is not enlarged.
No pelvic mass or lymphadenopathy. In the right groin, there is an
enlarged lymph node with a fatty hilum measuring 1.6 x 3.6 by
cm. There is inflammatory stranding in the fat around this lymph
node, consistent with inflammatory or infectious node. Additional
lymph nodes in the groin regions are not enlarged. Degenerative
changes in the lumbar spine. No destructive bone lesions.
IMPRESSION: No focal acute process demonstrated in the abdomen or pelvis. In the
right groin, there is an enlarged lymph node with fatty hilum and
surrounding inflammatory stranding suggesting infected or
inflammatory node.

## 2017-10-09 ENCOUNTER — Ambulatory Visit (INDEPENDENT_AMBULATORY_CARE_PROVIDER_SITE_OTHER): Payer: Non-veteran care | Admitting: Podiatry

## 2017-10-09 ENCOUNTER — Ambulatory Visit (INDEPENDENT_AMBULATORY_CARE_PROVIDER_SITE_OTHER): Payer: Non-veteran care

## 2017-10-09 ENCOUNTER — Other Ambulatory Visit: Payer: Self-pay | Admitting: Podiatry

## 2017-10-09 VITALS — BP 159/71 | HR 102 | Ht 68.5 in | Wt 201.0 lb

## 2017-10-09 DIAGNOSIS — E0843 Diabetes mellitus due to underlying condition with diabetic autonomic (poly)neuropathy: Secondary | ICD-10-CM

## 2017-10-09 DIAGNOSIS — M2142 Flat foot [pes planus] (acquired), left foot: Secondary | ICD-10-CM | POA: Diagnosis not present

## 2017-10-09 DIAGNOSIS — M2141 Flat foot [pes planus] (acquired), right foot: Secondary | ICD-10-CM

## 2017-10-09 DIAGNOSIS — M722 Plantar fascial fibromatosis: Secondary | ICD-10-CM

## 2017-10-11 NOTE — Progress Notes (Signed)
   Subjective:  77 year old male with past medical history of T2DM presents today as a new patient with a chief complaint of bilateral flat feet causing arch pain for several years.  He has tried wearing OTC inserts with no significant relief.  Patient presents today for further treatment and evaluation.   Past Medical History:  Diagnosis Date  . Diabetes mellitus without complication   . Hypercholesterolemia   . Hypertension      Objective/Physical Exam General: The patient is alert and oriented x3 in no acute distress.  Dermatology: Skin is warm, dry and supple bilateral lower extremities. Negative for open lesions or macerations.  Vascular: Palpable pedal pulses bilaterally. No edema or erythema noted. Capillary refill within normal limits.  Neurological: Epicritic and protective threshold grossly intact bilaterally.   Musculoskeletal Exam: Range of motion within normal limits to all pedal and ankle joints bilateral. Muscle strength 5/5 in all groups bilateral.  Upon weightbearing there is a medial longitudinal arch collapse bilaterally. Remove foot valgus noted to the bilateral lower extremities with excessive pronation upon mid stance. Hammertoe contracture deformity noted to digits 1-5 of the bilateral feet.   Radiographic Exam:    Pes planus noted on radiographic exam lateral views. Decreased calcaneal inclination and metatarsal declination angle is noted. Anterior break in the cyma line noted on lateral views. Medial talar head to deviation noted on AP radiograph.  Hammertoe contracture deformity noted to the interphalangeal joints and MPJ of the respective hammertoe digits mentioned on clinical musculoskeletal exam.    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  X-rays reviewed. #2 Appointment with Raiford Nobleick for DM shoes and insoles. #3 recommended OTC extra  strength Tylenol as needed. #4 return to clinic as needed.   Felecia ShellingBrent M. Evans, DPM [facility]  Dr. Felecia ShellingBrent M. Evans, DPM    [address]                                        [place], [place] [address]                Office [phone]  Fax [phone]

## 2017-10-12 ENCOUNTER — Ambulatory Visit: Payer: Medicare Other | Admitting: Orthotics

## 2017-10-12 DIAGNOSIS — M2142 Flat foot [pes planus] (acquired), left foot: Principal | ICD-10-CM

## 2017-10-12 DIAGNOSIS — M2141 Flat foot [pes planus] (acquired), right foot: Secondary | ICD-10-CM

## 2017-10-12 DIAGNOSIS — E0843 Diabetes mellitus due to underlying condition with diabetic autonomic (poly)neuropathy: Secondary | ICD-10-CM

## 2017-10-12 NOTE — Progress Notes (Signed)
[facility] [place] [facility] [place]

## 2018-01-04 ENCOUNTER — Telehealth: Payer: Self-pay | Admitting: *Deleted

## 2018-01-04 NOTE — Telephone Encounter (Signed)
Clance BollLakesha - [facility] request the last office notes to [phone].

## 2018-01-04 NOTE — Telephone Encounter (Signed)
Will get this request taken care of. Thank you.

## 2018-04-02 ENCOUNTER — Encounter: Payer: Medicare Other | Admitting: Podiatry

## 2018-04-09 NOTE — Progress Notes (Signed)
e  This encounter was created in error - please disregard.

## 2019-08-29 ENCOUNTER — Other Ambulatory Visit: Payer: Self-pay

## 2019-08-29 ENCOUNTER — Ambulatory Visit (HOSPITAL_COMMUNITY)
Admission: EM | Admit: 2019-08-29 | Discharge: 2019-08-29 | Disposition: A | Discharge status: Home / Self Care | Payer: Non-veteran care | Attending: Internal Medicine | Admitting: Internal Medicine

## 2019-08-29 ENCOUNTER — Encounter (HOSPITAL_COMMUNITY): Payer: Self-pay | Admitting: Emergency Medicine

## 2019-08-29 DIAGNOSIS — M5431 Sciatica, right side: Secondary | ICD-10-CM

## 2019-08-29 MED ORDER — PREDNISONE 10 MG PO TABS: 20.0000 mg | ORAL_TABLET | Freq: Every day | ORAL | 0 refills | Status: AC

## 2019-08-29 MED ORDER — IBUPROFEN 400 MG PO TABS: 400.0000 mg | ORAL_TABLET | Freq: Four times a day (QID) | ORAL | 0 refills | Status: AC | PRN

## 2019-08-29 NOTE — ED Provider Notes (Signed)
[facility]    CSN: [id number] Arrival date & time: 08/29/19  1004      History   Chief Complaint Chief Complaint  Patient presents with  . Leg Pain    right    HPI Alex Franco is a 78 y.o. male with a history of diabetes mellitus type 2 comes to urgent care with complaint of right-sided back pain of 2 to 3 weeks duration.  Patient says pain started insidiously and is gotten progressively worse.  He denies any trauma to the back.  Pain is currently about 8 out of 10.  Is aggravated by movement.  He has had some partial relief with ibuprofen use.  No fever or chills.  Patient has some numbness in the right great toe.  Pain radiates to the right leg.  No weakness in the right lower extremity.  No difficulty with bladder or bowel continence.  HPI  Past Medical History:  Diagnosis Date  . Diabetes mellitus without complication (HCC)   . Hypercholesterolemia   . Hypertension     Patient Active Problem List   Diagnosis Date Noted  . Cellulitis of foot 04/27/2014  . Inguinal lymphadenitis 04/26/2014  . Tenderness of right calf 04/26/2014  . Diabetes mellitus type 2, uncontrolled (HCC) 01/03/2013  . Hyponatremia 01/03/2013  . CKD (chronic kidney disease) 01/03/2013  . Hyperlipidemia 01/03/2013  . Hypertension 01/03/2013    Past Surgical History:  Procedure Laterality Date  . KNEE SURGERY         Home Medications    Prior to Admission medications   Medication Sig Start Date End Date Taking? Authorizing Provider  amLODipine (NORVASC) 10 MG tablet Take 10 mg by mouth daily.   Yes [provider]  aspirin 81 MG chewable tablet Chew 81 mg by mouth daily.   Yes [provider]  atorvastatin (LIPITOR) 40 MG tablet Take 20 mg by mouth daily.    Yes [provider]  insulin glargine (LANTUS) 100 UNIT/ML injection Inject 0.3 mLs (30 Units total) into the skin at bedtime. 01/04/13  Yes Short, Thea SilversmithMackenzie, MD  losartan (COZAAR) 50 MG tablet Take 50  mg by mouth daily.   Yes [provider]  metoprolol tartrate (LOPRESSOR) 25 MG tablet Take 25 mg by mouth 2 (two) times daily.   Yes [provider]  Multiple Vitamin (MULTIVITAMIN WITH MINERALS) TABS Take 1 tablet by mouth daily.   Yes [provider]  ibuprofen (ADVIL) 400 MG tablet Take 1 tablet (400 mg total) by mouth every 6 (six) hours as needed for up to 5 days. 08/29/19 09/03/19  Merrilee JanskyLamptey, Dene Landsberg O, MD  predniSONE (DELTASONE) 10 MG tablet Take 2 tablets (20 mg total) by mouth daily for 5 days. 08/29/19 09/03/19  Merrilee JanskyLamptey, Allani Reber O, MD  Insulin Regular Human (NOVOLIN R IJ) Inject 10 Units as directed daily as needed. Take only when sugar is high per patient  08/29/19  [provider]    Family History Family History  Problem Relation Age of Onset  . Tuberculosis Mother   . Cancer Father        prostate  . Cancer Sister        breast  . Cancer Sister        breast    Social History Social History   Tobacco Use  . Smoking status: Never Smoker  . Smokeless tobacco: Never Used  Substance Use Topics  . Alcohol use: No  . Drug use: No  Allergies   Other   Review of Systems Review of Systems  Constitutional: Negative for activity change, diaphoresis and fever.  HENT: Negative.   Respiratory: Negative for choking and shortness of breath.   Cardiovascular: Negative.   Gastrointestinal: Negative for diarrhea, nausea and vomiting.  Genitourinary: Negative.   Musculoskeletal: Positive for arthralgias and back pain. Negative for myalgias.  Skin: Negative.   Neurological: Positive for numbness. Negative for weakness, light-headedness and headaches.  Psychiatric/Behavioral: Negative for confusion and decreased concentration.     Physical Exam Triage Vital Signs ED Triage Vitals  Enc Vitals Group     BP      Pulse      Resp      Temp      Temp src      SpO2      Weight      Height      Head Circumference      Peak Flow      Pain  Score      Pain Loc      Pain Edu?      Excl. in [place]?    No data found.  Updated Vital Signs BP (!) 160/67 (BP Location: Right Arm)   Pulse 91   Temp 98.6 F (37 C) (Oral)   Resp (!) 24   SpO2 100%   Visual Acuity Right Eye Distance:   Left Eye Distance:   Bilateral Distance:    Right Eye Near:   Left Eye Near:    Bilateral Near:     Physical Exam Constitutional:      General: He is in acute distress.     Appearance: He is not ill-appearing.  HENT:     Right Ear: Tympanic membrane normal.     Left Ear: Tympanic membrane normal.  Cardiovascular:     Rate and Rhythm: Normal rate.     Pulses: Normal pulses.  Pulmonary:     Effort: Pulmonary effort is normal.     Breath sounds: Normal breath sounds. No wheezing, rhonchi or rales.  Abdominal:     General: Bowel sounds are normal.     Palpations: Abdomen is soft.     Tenderness: There is no abdominal tenderness.  Musculoskeletal: Normal range of motion.        General: No swelling, tenderness or signs of injury.  Skin:    General: Skin is warm.     Capillary Refill: Capillary refill takes less than 2 seconds.  Neurological:     General: No focal deficit present.     Mental Status: He is alert.     Sensory: No sensory deficit.     Motor: No weakness.     Coordination: Coordination normal.     Deep Tendon Reflexes: Reflexes normal.     Comments: Antalgic gait      UC Treatments / Results  Labs (all labs ordered are listed, but only abnormal results are displayed) Labs Reviewed - No data to display  EKG   Radiology No results found.  Procedures Procedures (including critical care time)  Medications Ordered in UC Medications - No data to display  Initial Impression / Assessment and Plan / UC Course  I have reviewed the triage vital signs and the nursing notes.  Pertinent labs & imaging results that were available during my care of the patient were reviewed by me and considered in my medical decision  making (see chart for details).     1.  Sciatica of the  right side: Prednisone 20 mg orally daily x5 days Ibuprofen 400 mg every 6 hours as needed for 5 days Gentle stretching If patient has worsening symptoms he is advised to return to urgent care for reevaluation. Final Clinical Impressions(s) / UC Diagnoses   Final diagnoses:  Sciatica of right side   Discharge Instructions   None    ED Prescriptions    Medication Sig Dispense Auth. Provider   predniSONE (DELTASONE) 10 MG tablet Take 2 tablets (20 mg total) by mouth daily for 5 days. 10 tablet Leiah Giannotti, Britta Mccreedy, MD   ibuprofen (ADVIL) 400 MG tablet Take 1 tablet (400 mg total) by mouth every 6 (six) hours as needed for up to 5 days. 30 tablet Rashan Rounsaville, Britta Mccreedy, MD     PDMP not reviewed this encounter.   Merrilee Jansky, MD 08/29/19 [phone]

## 2022-05-25 ENCOUNTER — Ambulatory Visit (INDEPENDENT_AMBULATORY_CARE_PROVIDER_SITE_OTHER): Payer: Managed Care, Other (non HMO) | Admitting: Podiatry

## 2022-05-25 DIAGNOSIS — M79674 Pain in right toe(s): Secondary | ICD-10-CM | POA: Diagnosis not present

## 2022-05-25 DIAGNOSIS — B351 Tinea unguium: Secondary | ICD-10-CM

## 2022-05-25 DIAGNOSIS — M79675 Pain in left toe(s): Secondary | ICD-10-CM | POA: Diagnosis not present

## 2022-05-25 NOTE — Progress Notes (Signed)
   SUBJECTIVE Patient with a history of diabetes mellitus presents to office today complaining of elongated, thickened nails that cause pain while ambulating in shoes.  Patient is unable to trim their own nails. Patient is here for further evaluation and treatment.   Past Medical History:  Diagnosis Date   Diabetes mellitus without complication (HCC)    Hypercholesterolemia    Hypertension     OBJECTIVE General Patient is awake, alert, and oriented x 3 and in no acute distress. Derm Skin is dry and supple bilateral. Negative open lesions or macerations. Remaining integument unremarkable. Nails are tender, long, thickened and dystrophic with subungual debris, consistent with onychomycosis, 1-5 bilateral. No signs of infection noted. Vasc  DP and PT pedal pulses palpable bilaterally. Temperature gradient within normal limits.  Neuro Epicritic and protective threshold sensation diminished bilaterally.  Musculoskeletal Exam No symptomatic pedal deformities noted bilateral. Muscular strength within normal limits.  ASSESSMENT 1. Diabetes Mellitus w/ peripheral neuropathy 2.  Pain due to onychomycosis of toenails bilateral  PLAN OF CARE 1. Patient evaluated today. 2. Instructed to maintain good pedal hygiene and foot care. Stressed importance of controlling blood sugar.  3. Mechanical debridement of nails 1-5 bilaterally performed using a nail nipper. Filed with dremel without incident.  4. Return to clinic in 3 mos.     Felecia Shelling, DPM [facility]  Dr. Felecia Shelling, DPM    [address] [place], [place] [address]                Office [phone]  Fax [phone]

## 2022-08-24 ENCOUNTER — Ambulatory Visit: Payer: Medicare Other | Admitting: Podiatry

## 2022-11-17 ENCOUNTER — Emergency Department (HOSPITAL_COMMUNITY): Payer: No Typology Code available for payment source

## 2022-11-17 ENCOUNTER — Other Ambulatory Visit: Payer: Self-pay

## 2022-11-17 ENCOUNTER — Inpatient Hospital Stay (HOSPITAL_COMMUNITY)
Admission: EM | Admit: 2022-11-17 | Discharge: 2022-11-19 | DRG: 101 | Disposition: A | Discharge status: Home Health | Payer: No Typology Code available for payment source | Attending: Internal Medicine | Admitting: Internal Medicine

## 2022-11-17 DIAGNOSIS — I129 Hypertensive chronic kidney disease with stage 1 through stage 4 chronic kidney disease, or unspecified chronic kidney disease: Secondary | ICD-10-CM | POA: Diagnosis present

## 2022-11-17 DIAGNOSIS — R471 Dysarthria and anarthria: Secondary | ICD-10-CM | POA: Diagnosis present

## 2022-11-17 DIAGNOSIS — Z7189 Other specified counseling: Secondary | ICD-10-CM | POA: Diagnosis not present

## 2022-11-17 DIAGNOSIS — Z7401 Bed confinement status: Secondary | ICD-10-CM

## 2022-11-17 DIAGNOSIS — S065XAS Traumatic subdural hemorrhage with loss of consciousness status unknown, sequela: Secondary | ICD-10-CM

## 2022-11-17 DIAGNOSIS — Z7982 Long term (current) use of aspirin: Secondary | ICD-10-CM | POA: Diagnosis not present

## 2022-11-17 DIAGNOSIS — E7800 Pure hypercholesterolemia, unspecified: Secondary | ICD-10-CM | POA: Diagnosis present

## 2022-11-17 DIAGNOSIS — Z831 Family history of other infectious and parasitic diseases: Secondary | ICD-10-CM | POA: Diagnosis not present

## 2022-11-17 DIAGNOSIS — Z803 Family history of malignant neoplasm of breast: Secondary | ICD-10-CM | POA: Diagnosis not present

## 2022-11-17 DIAGNOSIS — Z794 Long term (current) use of insulin: Secondary | ICD-10-CM | POA: Diagnosis not present

## 2022-11-17 DIAGNOSIS — I6200 Nontraumatic subdural hemorrhage, unspecified: Secondary | ICD-10-CM | POA: Diagnosis not present

## 2022-11-17 DIAGNOSIS — W19XXXS Unspecified fall, sequela: Secondary | ICD-10-CM | POA: Diagnosis present

## 2022-11-17 DIAGNOSIS — N1831 Chronic kidney disease, stage 3a: Secondary | ICD-10-CM | POA: Diagnosis present

## 2022-11-17 DIAGNOSIS — Z79899 Other long term (current) drug therapy: Secondary | ICD-10-CM

## 2022-11-17 DIAGNOSIS — E119 Type 2 diabetes mellitus without complications: Secondary | ICD-10-CM

## 2022-11-17 DIAGNOSIS — Z66 Do not resuscitate: Secondary | ICD-10-CM | POA: Diagnosis not present

## 2022-11-17 DIAGNOSIS — S065XAA Traumatic subdural hemorrhage with loss of consciousness status unknown, initial encounter: Secondary | ICD-10-CM

## 2022-11-17 DIAGNOSIS — G8191 Hemiplegia, unspecified affecting right dominant side: Secondary | ICD-10-CM | POA: Diagnosis present

## 2022-11-17 DIAGNOSIS — E669 Obesity, unspecified: Secondary | ICD-10-CM | POA: Diagnosis present

## 2022-11-17 DIAGNOSIS — R569 Unspecified convulsions: Secondary | ICD-10-CM | POA: Diagnosis not present

## 2022-11-17 DIAGNOSIS — R4701 Aphasia: Secondary | ICD-10-CM | POA: Diagnosis present

## 2022-11-17 DIAGNOSIS — E785 Hyperlipidemia, unspecified: Secondary | ICD-10-CM | POA: Diagnosis present

## 2022-11-17 DIAGNOSIS — I10 Essential (primary) hypertension: Secondary | ICD-10-CM | POA: Diagnosis present

## 2022-11-17 DIAGNOSIS — Z8679 Personal history of other diseases of the circulatory system: Secondary | ICD-10-CM | POA: Diagnosis not present

## 2022-11-17 DIAGNOSIS — Z6835 Body mass index (BMI) 35.0-35.9, adult: Secondary | ICD-10-CM

## 2022-11-17 DIAGNOSIS — Z515 Encounter for palliative care: Secondary | ICD-10-CM | POA: Diagnosis not present

## 2022-11-17 DIAGNOSIS — E1122 Type 2 diabetes mellitus with diabetic chronic kidney disease: Secondary | ICD-10-CM | POA: Diagnosis present

## 2022-11-17 DIAGNOSIS — Z8042 Family history of malignant neoplasm of prostate: Secondary | ICD-10-CM | POA: Diagnosis not present

## 2022-11-17 LAB — CBC
HCT: 40 % (ref 39.0–52.0)
Hemoglobin: 13.6 g/dL (ref 13.0–17.0)
MCH: 31 pg (ref 26.0–34.0)
MCHC: 34 g/dL (ref 30.0–36.0)
MCV: 91.1 fL (ref 80.0–100.0)
Platelets: 255 10*3/uL (ref 150–400)
RBC: 4.39 MIL/uL (ref 4.22–5.81)
RDW: 13.6 % (ref 11.5–15.5)
WBC: 7.2 10*3/uL (ref 4.0–10.5)
nRBC: 0 % (ref 0.0–0.2)

## 2022-11-17 LAB — I-STAT CHEM 8, ED
BUN: 24 mg/dL — ABNORMAL HIGH (ref 8–23)
Calcium, Ion: 0.97 mmol/L — ABNORMAL LOW (ref 1.15–1.40)
Chloride: 108 mmol/L (ref 98–111)
Creatinine, Ser: 1.8 mg/dL — ABNORMAL HIGH (ref 0.61–1.24)
Glucose, Bld: 103 mg/dL — ABNORMAL HIGH (ref 70–99)
HCT: 42 % (ref 39.0–52.0)
Hemoglobin: 14.3 g/dL (ref 13.0–17.0)
Potassium: 4.4 mmol/L (ref 3.5–5.1)
Sodium: 136 mmol/L (ref 135–145)
TCO2: 21 mmol/L — ABNORMAL LOW (ref 22–32)

## 2022-11-17 LAB — COMPREHENSIVE METABOLIC PANEL
ALT: 15 U/L (ref 0–44)
AST: 19 U/L (ref 15–41)
Albumin: 3.9 g/dL (ref 3.5–5.0)
Alkaline Phosphatase: 107 U/L (ref 38–126)
Anion gap: 13 (ref 5–15)
BUN: 21 mg/dL (ref 8–23)
CO2: 21 mmol/L — ABNORMAL LOW (ref 22–32)
Calcium: 9.8 mg/dL (ref 8.9–10.3)
Chloride: 103 mmol/L (ref 98–111)
Creatinine, Ser: 1.68 mg/dL — ABNORMAL HIGH (ref 0.61–1.24)
GFR, Estimated: 40 mL/min — ABNORMAL LOW (ref >60)
Glucose, Bld: 106 mg/dL — ABNORMAL HIGH (ref 70–99)
Potassium: 4.2 mmol/L (ref 3.5–5.1)
Sodium: 137 mmol/L (ref 135–145)
Total Bilirubin: 0.9 mg/dL (ref 0.3–1.2)
Total Protein: 7.1 g/dL (ref 6.5–8.1)

## 2022-11-17 LAB — DIFFERENTIAL
Abs Immature Granulocytes: 0.02 10*3/uL (ref 0.00–0.07)
Basophils Absolute: 0 10*3/uL (ref 0.0–0.1)
Basophils Relative: 0 %
Eosinophils Absolute: 0 10*3/uL (ref 0.0–0.5)
Eosinophils Relative: 1 %
Immature Granulocytes: 0 %
Lymphocytes Relative: 34 %
Lymphs Abs: 2.4 10*3/uL (ref 0.7–4.0)
Monocytes Absolute: 0.6 10*3/uL (ref 0.1–1.0)
Monocytes Relative: 9 %
Neutro Abs: 4.1 10*3/uL (ref 1.7–7.7)
Neutrophils Relative %: 56 %

## 2022-11-17 LAB — URINALYSIS, ROUTINE W REFLEX MICROSCOPIC
Bilirubin Urine: NEGATIVE
Glucose, UA: NEGATIVE mg/dL
Hgb urine dipstick: NEGATIVE
Ketones, ur: NEGATIVE mg/dL
Leukocytes,Ua: NEGATIVE
Nitrite: NEGATIVE
Protein, ur: NEGATIVE mg/dL
Specific Gravity, Urine: 1.015 (ref 1.005–1.030)
pH: 6 (ref 5.0–8.0)

## 2022-11-17 LAB — PROTIME-INR
INR: 1 (ref 0.8–1.2)
Prothrombin Time: 13.4 seconds (ref 11.4–15.2)

## 2022-11-17 LAB — RAPID URINE DRUG SCREEN, HOSP PERFORMED
Amphetamines: NOT DETECTED
Barbiturates: NOT DETECTED
Benzodiazepines: NOT DETECTED
Cocaine: NOT DETECTED
Opiates: NOT DETECTED
Tetrahydrocannabinol: NOT DETECTED

## 2022-11-17 LAB — APTT: aPTT: 29 seconds (ref 24–36)

## 2022-11-17 LAB — CBG MONITORING, ED: Glucose-Capillary: 103 mg/dL — ABNORMAL HIGH (ref 70–99)

## 2022-11-17 LAB — ETHANOL: Alcohol, Ethyl (B): <10 mg/dL (ref <10)

## 2022-11-17 MED ORDER — LEVETIRACETAM IN NACL 1000 MG/100ML IV SOLN
2000.0000 mg | Freq: Once | INTRAVENOUS | Status: AC
  Administered 2022-11-17: 2000 mg via INTRAVENOUS

## 2022-11-17 MED ORDER — CLEVIDIPINE BUTYRATE 0.5 MG/ML IV EMUL
0.0000 mg/h | INTRAVENOUS | Status: DC
  Administered 2022-11-17: 3 mg/h via INTRAVENOUS

## 2022-11-17 MED ORDER — CLEVIDIPINE BUTYRATE 0.5 MG/ML IV EMUL
0.0000 mg/h | INTRAVENOUS | Status: DC
  Administered 2022-11-17: 2 mg/h via INTRAVENOUS
  Filled 2022-11-17: qty 100

## 2022-11-17 MED ORDER — SODIUM CHLORIDE 0.9 % IV SOLN
750.0000 mg | Freq: Two times a day (BID) | INTRAVENOUS | Status: DC
  Administered 2022-11-18 – 2022-11-19 (×3): 750 mg via INTRAVENOUS
  Filled 2022-11-17 (×5): qty 7.5

## 2022-11-17 NOTE — Procedures (Signed)
Patient Name: Alex Franco  MRN: [id number]  Epilepsy Attending: Lora Havens  Referring Physician/Provider: Lorenza Chick, MD  Date: 11/17/2022 Duration: 25.23 mins  Patient history: 82 y.o. male presenting with a possible worsening of recent traumatic subdural hematoma s/p bur hole done through the [place] presenting wth seizure. EEG to evaluate for seizure.  Level of alertness: Awake, asleep  AEDs during EEG study: LEV  Technical aspects: This EEG study was done with scalp electrodes positioned according to the 10-20 International system of electrode placement. Electrical activity was reviewed with band pass filter of 1-'70Hz'$ , sensitivity of 7 uV/mm, display speed of 85m/sec with a '60Hz'$  notched filter applied as appropriate. EEG data were recorded continuously and digitally stored.  Video monitoring was available and reviewed as appropriate.  Description: The posterior dominant rhythm consists of 8 Hz activity of moderate voltage (25-35 uV) seen predominantly in posterior head regions, symmetric and reactive to eye opening and eye closing. Sleep was characterized by vertex waves, sleep spindles (12 to 14 Hz), maximal frontocentral region. EEG showed intermittent 3 to 5 Hz theta-delta slowing in left fronto-temporal region. Hyperventilation and photic stimulation were not performed.     ABNORMALITY - Intermittent slow, left fronto-temporal region  IMPRESSION: This study is suggestive of cortical dysfunction arising from left fronto-temporal region likely secondary to underlying SDH. No seizures or epileptiform discharges were seen throughout the recording.  Please note that lack of epileptiform activity during  interictal EEG does not exclude  the diagnosis of epilepsy.  Kallen Mccrystal OBarbra Sarks

## 2022-11-17 NOTE — Code Documentation (Signed)
Stroke Response Nurse Documentation Code Documentation  Jonis Orloff is a 82 y.o. male arriving to [facility]  via [facility] on 11/17/2022 with past medical hx of SDH. Code stroke was activated by EMS.   Patient from home where he was LKW at 1500 when he was talking about dinner with his wife and now complaining of aphasia and weakness. Pt had history of SDH over a month ago with surgery two weeks ago. Pt had some mild right sided weakness at baseline, but today at 1515 the patient had owrsening right sided weakness and aphasia that mimicked symptoms from a month ago.   Stroke team at the bedside on patient arrival. Labs drawn and patient cleared for CT by EDP. Patient to CT with team. NIHSS 17, see documentation for details and code stroke times. Patient with disoriented, not following commands, left gaze preference , right hemianopia, bilateral arm weakness, bilateral leg weakness, Global aphasia , and dysarthria  on exam. The following imaging was completed:  CT Head. Patient is not a candidate for IV Thrombolytic due to recent SDH. Patient is not a candidate for IR due to no LVO suspected per MD.   Care Plan: SBP 130-150 mmhg, q1 NIHSS and Pupils  Bedside handoff with ED RN [place].    Kathrin Greathouse  Stroke Response RN

## 2022-11-17 NOTE — ED Notes (Signed)
RN assisted pt in using urinal. Pt speech has improved and he is alert and oriented. Some slurred speech and expressive aphasia remains, but speech delay is much less pronounced. Mild right sided weakness remains, but no drift in upper or lower extremities.

## 2022-11-17 NOTE — H&P (Signed)
SeizuresPCP:   Center, [facility]   Chief Complaint:  Weakness, about speech  HPI: This 82 year old male with past medical history for recent SDH, tx at the [place] with bur holes.  It presented as right-sided weakness and speech difficulty.  Since discharge she has been doing PT, steadily improving.  He is walking with a walker.  Today they said there is a good day, they went shopping and chills.  On returning home, he stepped in a car and felt immediately dizzy.  His wife went to unlock the front door to help him in.  Per wife, the patient could hardly pick up his leg to walk towards the house.  It took all her efforts, he entered to the front door and went down.  He lost the strength in his right upper and lower extremities.  He is lost his speech, which became very garbled.  Per wife she reports twitching but no overt seizures.  He reports numbness. He was brought to the ER.  On presentation to the ER the patient's speech remains very slow garbled very difficult to understand.  He had residual right-sided weakness.  Code stroke was called.  CT head showed mixed density 1.6 cm thick left cerebral convexity subdural hematoma. Areas of hyperdensity within this hematoma are compatible with acute/recent hemorrhage. Approximately 6 mm of rightward shift.  Neurology and neurosurgery were both consulted   Review of Systems:  The patient denies anorexia, fever, weight loss,, vision loss, decreased hearing, hoarseness, chest pain, syncope, dyspnea on exertion, peripheral edema, balance deficits, hemoptysis, abdominal pain, melena, hematochezia, severe indigestion/heartburn, hematuria, incontinence, genital sores, muscle weakness, suspicious skin lesions, transient blindness, difficulty walking, depression, unusual weight change, abnormal bleeding, enlarged lymph nodes, angioedema, and breast masses. Positives: Confusion, garbled speech, right-sided weakness, shaking &/or seizures  Past Medical  History: Past Medical History:  Diagnosis Date   Diabetes mellitus without complication ([place])    Hypercholesterolemia    Hypertension    Past Surgical History:  Procedure Laterality Date   KNEE SURGERY      Medications: Prior to Admission medications   Medication Sig Start Date End Date Taking? Authorizing Provider  amLODipine (NORVASC) 10 MG tablet Take 10 mg by mouth daily.    [provider]  aspirin 81 MG chewable tablet Chew 81 mg by mouth daily.    [provider]  atorvastatin (LIPITOR) 40 MG tablet Take 20 mg by mouth daily.     [provider]  insulin glargine (LANTUS) 100 UNIT/ML injection Inject 0.3 mLs (30 Units total) into the skin at bedtime. 01/04/13   Janece Canterbury, MD  losartan (COZAAR) 50 MG tablet Take 50 mg by mouth daily.    [provider]  metoprolol tartrate (LOPRESSOR) 25 MG tablet Take 25 mg by mouth 2 (two) times daily.    [provider]  Multiple Vitamin (MULTIVITAMIN WITH MINERALS) TABS Take 1 tablet by mouth daily.    [provider]  Insulin Regular Human (NOVOLIN R IJ) Inject 10 Units as directed daily as needed. Take only when sugar is high per patient  08/29/19  [provider]    Allergies:   Allergies  Allergen Reactions   Other Anaphylaxis    To Walnuts     Social History:  reports that he has never smoked. He has never used smokeless tobacco. He reports that he does not drink alcohol and does not use drugs.  Family History: Family History  Problem Relation Age of Onset  Tuberculosis Mother    Cancer Father        prostate   Cancer Sister        breast   Cancer Sister        breast    Physical Exam: Vitals:   11/17/22 2045 11/17/22 2100 11/17/22 2130 11/17/22 2228  BP: 139/67 134/68 136/70 135/64  Pulse: 78 79 78 73  Resp: 20 18 (!) 24 20  Temp:      TempSrc:      SpO2: 99% 99% 100% 100%  Weight:      Height:        General:  Alert and oriented times  three, well developed and nourished, no acute distress, globally weak, speech slow but clear and coherent Eyes: PERRLA, pink conjunctiva, no scleral icterus ENT: Moist oral mucosa, neck supple, no thyromegaly Lungs: clear to ascultation, no wheeze, no crackles, no use of accessory muscles Cardiovascular: regular rate and rhythm, no regurgitation, no gallops, no murmurs. No carotid bruits, no JVD Abdomen: soft, positive BS, non-tender, non-distended, no organomegaly, not an acute abdomen GU: not examined Neuro: CN II - XII grossly intact, sensation intact Musculoskeletal: strength 5/5 all extremities, globally weak Skin: no rash, no subcutaneous crepitation, no decubitus Psych: appropriate patient   Labs on Admission:  Recent Labs    11/17/22 1625 11/17/22 1630  NA 137 136  K 4.2 4.4  CL 103 108  CO2 21*  --   GLUCOSE 106* 103*  BUN 21 24*  CREATININE 1.68* 1.80*  CALCIUM 9.8  --    Recent Labs    11/17/22 1625  AST 19  ALT 15  ALKPHOS 107  BILITOT 0.9  PROT 7.1  ALBUMIN 3.9    Recent Labs    11/17/22 1625 11/17/22 1630  WBC 7.2  --   NEUTROABS 4.1  --   HGB 13.6 14.3  HCT 40.0 42.0  MCV 91.1  --   PLT 255  --      Radiological Exams on Admission: CT HEAD CODE STROKE WO CONTRAST  Result Date: 11/17/2022 CLINICAL DATA:  Code stroke.  Neuro deficit, acute, stroke suspected EXAM: CT HEAD WITHOUT CONTRAST TECHNIQUE: Contiguous axial images were obtained from the base of the skull through the vertex without intravenous contrast. RADIATION DOSE REDUCTION: This exam was performed according to the departmental dose-optimization program which includes automated exposure control, adjustment of the mA and/or kV according to patient size and/or use of iterative reconstruction technique. COMPARISON:  None Available. FINDINGS: Brain: Mixed density 1.6 cm thick left cerebral convexity subdural hematoma. Areas of hyperdensity within this hematoma are compatible with acute/recent  hemorrhage. Approximately 6 mm of rightward shift. No evidence of acute large vascular territory infarct. Patchy white matter hypodensities, nonspecific but compatible with chronic microvascular ischemic disease. No evidence of mass lesion or hydrocephalus. Partially effaced left lateral ventricle. Vascular: No hyperdense vessel identified. Skull: Left-sided burr holes.  No acute fracture. Sinuses/Orbits: Remote appearing left medial orbital wall fracture. Clear sinuses. Other: No mastoid effusions. IMPRESSION: Mixed density 1.6 cm thick left cerebral convexity subdural hematoma. Areas of hyperdensity within this hematoma are compatible with acute/recent hemorrhage. Approximately 6 mm of rightward shift. Code stroke imaging results were communicated on 11/17/2022 at 4:30 pm to provider Bhagat via telephone, who verbally acknowledged these results. Electronically Signed   By: Margaretha Sheffield M.D.   On: 11/17/2022 16:34    Assessment/Plan Present on Admission:  Seizures  -Status post SDH with bur hole and midline shift -Patient  likely with seizure, secondary to [facility] -Neurology consulted, EEG ordered.  Patient loaded with IV Keppra 2 g, 750 IV twice daily. Clevidipine gtt ordered goal systolic blood pressure [id number].  No ASA/plavix with recent SDH -NS Dr Marcello Moores recommends seizure control then follow-up with his NS team at [facility] -Seizure precautions..  Ativan PRN seizures -N.p.o. until patient more alert.  IV fluid hydration   Recent SDH -Follow-up outpatient with an S   Diabetes mellitus type 2 -NPO.  50% of Lantus 15 units ordered, sliding scale insulin every 4 hours.   Hypertension -Clevidipine gtt ordered goal systolic blood pressure [id number].   Hyperlipidemia -Atorvastatin on hold  CKD stage 3 -Stable at baseline  Consultants Dr. Cheral Marker and Dr. Manon Hilding input appreciated  Abriella Filkins 11/17/2022, 10:40 PM

## 2022-11-17 NOTE — Progress Notes (Signed)
Patient BP very sensitive to Cleviprex. Titrated per protocol and then order changed to allow titration 1 mg/hr for better BP control.

## 2022-11-17 NOTE — Plan of Care (Signed)
EEG shows no seizures.   Electronically signed: Dr. Kerney Elbe

## 2022-11-17 NOTE — Progress Notes (Signed)
82 yo M with hx of chronic L SDH s/p bur holes 1 month ago at [facility].  He has been following up with Dr. Luther Hearing who has discussed with him possible middle meningeal artery embolization for residual/recurrent SDH.  He presented to the ER after seizure.  CT head showed left chronic SDH.  I do not see any significant acute SDH or any indication for acute surgical intervention.  Once his seizures are controlled, it would be reasonable for him to f/u with his neurosurgery team at [facility].  He has an appointment on 3/8 but can call them for a sooner appointment.

## 2022-11-17 NOTE — ED Triage Notes (Signed)
Pt arrived via [facility] for code stroke from home. Pt was out with wife this afternoon and was at baseline (A&Ox4, GCS 15, clear speech, ambulatory without assist device, very mild right sided weakness from stroke (hem) last month). Upon arriving home (1520) wife noted difficulty ambulating, slurred speech and called 911. EMS note aphasia and mild right sided weakness. 20g IV RH.   PTA EMS Vitals BP 180/100 HR 90 SPO2 97% RA RR 20 CBG 98

## 2022-11-17 NOTE — ED Provider Notes (Signed)
[facility] Provider Note   CSN: [id number] Arrival date & time: 11/17/22  1615  An emergency department physician performed an initial assessment on this suspected stroke patient at 64.  History  Chief Complaint  Patient presents with   Code Stroke    Alex Franco is a 82 y.o. male with a history of a subdural bleed presenting to the ED with aphasia, stumbling, weakness.  Patient last noted well around 3 PM today while he was out and about with his wife.  When he got out of his car around 3:20 PM he was noted to be stumbling, difficulty with his speech.  Patient is extremely anxious.  Medical records show the patient was treated in the hospital at the [facility] system for a large left-sided subdural bleed with bur holes.  On repeat CT imaging most recently Oct 28, 2022, there was a measured 1.8 cm left subdural bleed with a 9 mm midline shift.  The patient is not on any anticoagulation   Update - his wife provides history now mentioning concern for seizure  HPI     Home Medications Prior to Admission medications   Medication Sig Start Date End Date Taking? Authorizing Provider  amLODipine (NORVASC) 10 MG tablet Take 10 mg by mouth daily.    [provider]  aspirin 81 MG chewable tablet Chew 81 mg by mouth daily.    [provider]  atorvastatin (LIPITOR) 40 MG tablet Take 20 mg by mouth daily.     [provider]  insulin glargine (LANTUS) 100 UNIT/ML injection Inject 0.3 mLs (30 Units total) into the skin at bedtime. 01/04/13   Janece Canterbury, MD  losartan (COZAAR) 50 MG tablet Take 50 mg by mouth daily.    [provider]  metoprolol tartrate (LOPRESSOR) 25 MG tablet Take 25 mg by mouth 2 (two) times daily.    [provider]  Multiple Vitamin (MULTIVITAMIN WITH MINERALS) TABS Take 1 tablet by mouth daily.    [provider]  Insulin Regular Human (NOVOLIN R IJ) Inject 10 Units as directed  daily as needed. Take only when sugar is high per patient  08/29/19  [provider]      Allergies    Other    Review of Systems   Review of Systems  Physical Exam Updated Vital Signs BP 136/70   Pulse 78   Temp 98.3 F (36.8 C) (Oral)   Resp (!) 24   Ht 5' 5"$  (1.651 m)   Wt 97.6 kg   SpO2 100%   BMI 35.81 kg/m  Physical Exam  ED Results / Procedures / Treatments   Labs (all labs ordered are listed, but only abnormal results are displayed) Labs Reviewed  COMPREHENSIVE METABOLIC PANEL - Abnormal; Notable for the following components:      Result Value   CO2 21 (*)    Glucose, Bld 106 (*)    Creatinine, Ser 1.68 (*)    GFR, Estimated 40 (*)    All other components within normal limits  I-STAT CHEM 8, ED - Abnormal; Notable for the following components:   BUN 24 (*)    Creatinine, Ser 1.80 (*)    Glucose, Bld 103 (*)    Calcium, Ion 0.97 (*)    TCO2 21 (*)    All other components within normal limits  CBG MONITORING, ED - Abnormal; Notable for the following components:   Glucose-Capillary 103 (*)    All  other components within normal limits  ETHANOL  PROTIME-INR  APTT  CBC  DIFFERENTIAL  RAPID URINE DRUG SCREEN, HOSP PERFORMED  URINALYSIS, ROUTINE W REFLEX MICROSCOPIC    EKG None  Radiology CT HEAD CODE STROKE WO CONTRAST  Result Date: 11/17/2022 CLINICAL DATA:  Code stroke.  Neuro deficit, acute, stroke suspected EXAM: CT HEAD WITHOUT CONTRAST TECHNIQUE: Contiguous axial images were obtained from the base of the skull through the vertex without intravenous contrast. RADIATION DOSE REDUCTION: This exam was performed according to the departmental dose-optimization program which includes automated exposure control, adjustment of the mA and/or kV according to patient size and/or use of iterative reconstruction technique. COMPARISON:  None Available. FINDINGS: Brain: Mixed density 1.6 cm thick left cerebral convexity subdural hematoma. Areas of  hyperdensity within this hematoma are compatible with acute/recent hemorrhage. Approximately 6 mm of rightward shift. No evidence of acute large vascular territory infarct. Patchy white matter hypodensities, nonspecific but compatible with chronic microvascular ischemic disease. No evidence of mass lesion or hydrocephalus. Partially effaced left lateral ventricle. Vascular: No hyperdense vessel identified. Skull: Left-sided burr holes.  No acute fracture. Sinuses/Orbits: Remote appearing left medial orbital wall fracture. Clear sinuses. Other: No mastoid effusions. IMPRESSION: Mixed density 1.6 cm thick left cerebral convexity subdural hematoma. Areas of hyperdensity within this hematoma are compatible with acute/recent hemorrhage. Approximately 6 mm of rightward shift. Code stroke imaging results were communicated on 11/17/2022 at 4:30 pm to provider Bhagat via telephone, who verbally acknowledged these results. Electronically Signed   By: Margaretha Sheffield M.D.   On: 11/17/2022 16:34    Procedures Procedures    Medications Ordered in ED Medications  clevidipine (CLEVIPREX) infusion 0.5 mg/mL (1 mg/hr Intravenous Rate/Dose Change 11/17/22 1746)  levETIRAcetam (KEPPRA) 750 mg in sodium chloride 0.9 % 100 mL IVPB (has no administration in time range)  levETIRAcetam (KEPPRA) IVPB 1000 mg/100 mL premix (0 mg Intravenous Stopped 11/17/22 1722)    ED Course/ Medical Decision Making/ A&P Clinical Course as of 11/17/22 2223  Thu Nov 17, 2022  1638 Mixed density 1.6 cm thick left cerebral convexity subdural hematoma. Areas of hyperdensity within this hematoma are compatible with acute/recent hemorrhage. Approximately 6 mm of rightward shift.  Code stroke imaging results were communicated on 11/17/2022 at 4:30 pm to provider Bhagat via telephone, who verbally acknowledged these results. [MT]  [place] recommending EEG monitoring.  Patient reassessed and has slow and stuttering speech.  He is moving  all extremities to command.  He is tearful and anxious.  His wife is also present at the bedside.  I am waiting to hear back from neurosurgery regarding the intracranial bleed without anticipate observation in the hospital and admission [MT]  1918 I asked the secretary to repage neurosurgery [MT]  2044 I called [facility] [place] personally, they are going to page on call neurosurgeon now. Unclear if they received prior pages per their receptionist  [MT]  2105 Neurosurgeon Dr Marcello Moores on the phone has no emergent recommendations. He does not feel the patient is requiring emergency surgery, did not have repeat imaging recommendations.  He will leave a note in the patient's chart.  I anticipate medical admission for observation in the hospital. [MT]  2157 Neurologist reports no seizure activity noted on EEG< recommending medical admit [MT]  2221 Admitted to hospitalist [MT]    Clinical Course User Index [MT] Thurza Kwiecinski, Carola Rhine, MD  Medical Decision Making Amount and/or Complexity of Data Reviewed Labs: ordered. Radiology: ordered.  Risk Prescription drug management. Decision regarding hospitalization.   This patient presents to the Emergency Department with complaint of altered mental status.  This involves an extensive number of treatment options, and is a complaint that carries with it a high risk of complications and morbidity.  The differential diagnosis includes hypoglycemia vs metabolic encephalopathy vs infection (including cystitis) vs ICH vs stroke vs polypharmacy vs other  The patient's wife reports that she witnessed seizure-like activity prior to this episode.  Therefore he may also be postictal versus status epilepticus.  Neurology was consulted on arrival as a code stroke.  The patient had stat CT scan performed which showed what appears to be stable subdural bleed with unchanged and perhaps improved midline shift from his prior imaging, per my review of  external records.  The case was discussed with neurosurgery, please see ED course, who felt that this was all chronic bleed, and that there is no emergent indication for surgery.  Patient subsequently had an EEG performed which did not show acute epileptic activity per the neurologist.  However he did not return to his baseline mental state according to his wife, and he continues to have very slow speech.  Therefore he will be admitted to the hospitalist.  I ordered, reviewed, and interpreted labs, including no acute emergent findings I ordered medication Keppra and clevidipine ordered and consultation with neurology I ordered imaging studies which included CT scan of the head I independently visualized and interpreted imaging which showed persistent subdural bleed and unchanged midline shift and the monitor tracing which showed normal sinus rhythm Additional history was obtained from EMS, patient's wife Previous records obtained and reviewed showing Home records  I consulted neurology, neurosurgery and discussed lab and imaging findings.  Please see ED course  After the interventions stated above, I reevaluated the patient and found some improvement overall of his cognition, patient is fully oriented, following commands since his arrival.  Likely over the postictal state.  But still not returned to his baseline mental status.         Final Clinical Impression(s) / ED Diagnoses Final diagnoses:  Subdural bleeding [facility])    Rx / DC Orders ED Discharge Orders     None         Wyvonnia Dusky, MD 11/17/22 2223

## 2022-11-17 NOTE — Consult Note (Signed)
Neurology Consultation  Reason for Consult: Code Stroke Referring Physician: Trifan  CC:  History is obtained from: Family  HPI: Alex Franco is a 82 y.o. male with a past medical history of HLD, HTN, DM, recent subdural after a fall with burr hole evacuation (at the [place]),  presenting with aphasia and worsening right sided weakness. Patient last noted well around 3 PM today while he was out with his wife.  When he got out of his car around 3:20 PM he was noted to be stumbling and having difficulty with his speech.  His wife states that he went to physical therapy this morning.  He is still having right-sided weakness however he has been able to walk.  He has not had any difficulty with his speech since his procedure.  When they got out of the car she noticed that he was not able to move his right leg as easily.  She then went to get his walker and when she came back out he was not able to lift his right leg off the ground at all.   Medical records show the patient was treated in the hospital at the [facility] system for a large left-sided subdural bleed with bur holes 10/23/2022.  He had repeat CT imaging most recently Oct 28, 2022, there was a measured 1.8 cm left subdural bleed with a 9 mm midline shift.  According to the patient's wife, he did have a seizure when he had his subdural hematoma that included altered mental status and tongue movements -- on further questioning he did have these symptoms today prior to the worsening weakness.  He had been taken off of his Keppra.  He had previously been on 1000 mg twice daily.  The patient is not on any anticoagulation. On arrival to the ED Patient is extremely anxious he is following commands, but unable to get words out.  LKW: 1500 tpa given?: No recent SDH Premorbid modified Rankin scale (mRS):  0-Completely asymptomatic and back to baseline post-stroke 1-No significant post stroke disability and can perform usual duties with stroke symptoms 2-Slight  disability-UNABLE to perform all activities but does not need assistance  3-Moderate disability-requires help but walks WITHOUT assistance 4-Needs assistance to walk and tend to bodily needs 5-Severe disability-bedridden, incontinent, needs constant attention 6- Death  ICH score not applicable for SDH   [id number] to obtain due to altered mental status.   Past Medical History:  Diagnosis Date   Diabetes mellitus without complication ([place])    Hypercholesterolemia    Hypertension      Family History  Problem Relation Age of Onset   Tuberculosis Mother    Cancer Father        prostate   Cancer Sister        breast   Cancer Sister        breast     Social History:   reports that he has never smoked. He has never used smokeless tobacco. He reports that he does not drink alcohol and does not use drugs.  Medications No current facility-administered medications for this encounter.  Current Outpatient Medications:    amLODipine (NORVASC) 10 MG tablet, Take 10 mg by mouth daily., Disp: , Rfl:    aspirin 81 MG chewable tablet, Chew 81 mg by mouth daily., Disp: , Rfl:    atorvastatin (LIPITOR) 40 MG tablet, Take 20 mg by mouth daily. , Disp: , Rfl:    insulin glargine (LANTUS) 100 UNIT/ML injection, Inject 0.3 mLs (30 Units  total) into the skin at bedtime., Disp: 10 mL, Rfl: 1   losartan (COZAAR) 50 MG tablet, Take 50 mg by mouth daily., Disp: , Rfl:    metoprolol tartrate (LOPRESSOR) 25 MG tablet, Take 25 mg by mouth 2 (two) times daily., Disp: , Rfl:    Multiple Vitamin (MULTIVITAMIN WITH MINERALS) TABS, Take 1 tablet by mouth daily., Disp: , Rfl:   Exam: Current vital signs: BP 131/65   Pulse 72   Temp 98.3 F (36.8 C) (Oral)   Resp 18   Ht 5' 5"$  (1.651 m)   Wt 97.6 kg   SpO2 100%   BMI 35.81 kg/m  Vital signs in last 24 hours: Temp:  [98.3 F (36.8 C)] 98.3 F (36.8 C) (02/22 1642) Pulse Rate:  [72-106] 72 (02/22 1900) Resp:  [13-22] 18 (02/22 1900) BP:  (118-178)/(65-89) 131/65 (02/22 1900) SpO2:  [99 %-100 %] 100 % (02/22 1900) Weight:  [97.6 kg] 97.6 kg (02/22 1625)     GENERAL: Awake, highly anxious which does limit examination, perseverates on wanting to go home HEENT: - Normocephalic and atraumatic, dry mm, no LN++, no Thyromegally LUNGS - not audibly wheezing CV - S1S2 RRR, no m/r/g, equal pulses bilaterally. ABDOMEN - Soft, nontender, nondistended with normoactive BS Ext: warm, well perfused, intact peripheral pulses, no edema  NEURO:  Mental Status: Awake, anxious, unable to answer questions.  He does attempt to say the month and his age but states age incorrectly as 22. Language: speech is dysarthric.  Initially it is almost unintelligible, but this improves throughout the exam Cranial Nerves: PERRL mm/brisk. EOMI, visual fields full, no facial asymmetry, facial sensation intact, hearing intact, tongue/uvula/soft palate midline, normal sternocleidomastoid and trapezius muscle strength. No evidence of tongue atrophy or fasciculations Motor: Moves all extremities antigravity, but does not maintain elevation of the bed Sensation-grimaces to painful stimuli Coordination: FTN intact bilaterally, no ataxia in BLE. Gait- deferred    NIHSS components Score: Comment  1a Level of Conscious 0[x]$  1[]$  2[]$  3[]$         1b LOC Questions 0[]$  1[]$  2[x]$           1c LOC Commands 0[]$  1[x]$  2[]$           2 Best Gaze 0[]$  1[x]$  2[]$           3 Visual 0[]$  1[]$  2[x]$  3[]$         4 Facial Palsy 0[x]$  1[]$  2[]$  3[]$         5a Motor Arm - left 0[]$  1[]$  2[x]$  3[]$  4[]$  UN[]$     5b Motor Arm - Right 0[]$  1[]$  2[x]$  3[]$  4[]$  UN[]$     6a Motor Leg - Left 0[]$  1[]$  2[x]$  3[]$  4[]$  UN[]$     6b Motor Leg - Right 0[]$  1[]$  2[x]$  3[]$  4[]$  UN[]$     7 Limb Ataxia 0[x]$  1[]$  2[]$  3[]$  UN[]$       8 Sensory 0[x]$  1[]$  2[]$  UN[]$         9 Best Language 0[]$  1[]$  2[x]$  3[]$         10 Dysarthria 0[]$  1[x]$  2[]$  UN[]$         11 Extinct. and Inattention 0[x]$  1[]$  2[]$           TOTAL: 17        Labs I have  reviewed labs in epic and the results pertinent to this consultation are:   CBC    Component Value Date/Time   WBC 8.6 04/27/2014 2335   RBC 4.37 04/27/2014 2335   HGB 14.0 04/27/2014  2335   HCT 39.3 04/27/2014 2335   PLT 186 04/27/2014 2335   MCV 89.9 04/27/2014 2335   MCH 32.0 04/27/2014 2335   MCHC 35.6 04/27/2014 2335   RDW 14.2 04/27/2014 2335   LYMPHSABS 0.7 04/25/2014 1807   MONOABS 0.6 04/25/2014 1807   EOSABS 0.0 04/25/2014 1807   BASOSABS 0.0 04/25/2014 1807    CMP     Component Value Date/Time   NA 137 04/27/2014 2335   K 4.2 04/27/2014 2335   CL 101 04/27/2014 2335   CO2 22 04/27/2014 2335   GLUCOSE 120 (H) 04/27/2014 2335   BUN 18 04/27/2014 2335   CREATININE 1.43 (H) 04/27/2014 2335   CALCIUM 8.8 04/27/2014 2335   PROT 7.1 04/25/2014 1807   ALBUMIN 3.7 04/25/2014 1807   AST 33 04/25/2014 1807   ALT 23 04/25/2014 1807   ALKPHOS 90 04/25/2014 1807   BILITOT 0.5 04/25/2014 1807   GFRNONAA 46 (L) 04/27/2014 2335   GFRAA 54 (L) 04/27/2014 2335    Lipid Panel     Component Value Date/Time   CHOL 77 04/26/2014 0543   TRIG 53 04/26/2014 0543   HDL 43 04/26/2014 0543   CHOLHDL 1.8 04/26/2014 0543   VLDL 11 04/26/2014 0543   LDLCALC 23 04/26/2014 0543     Imaging I have reviewed the images obtained:  CT-head Mixed density 1.6 cm thick left cerebral convexity subdural hematoma. Areas of hyperdensity within this hematoma are compatible with acute/recent hemorrhage. Approximately 6 mm of rightward shift.  Assessment:  82 y.o. male presenting with a possible worsening of recent traumatic subdural hematoma s/p bur hole done through the [place].  He will need a neurosurgical consult and strict blood pressure control.  Given seizure activity at the onset of this episode will load with 2 g of Keppra and restart home Keppra 1000 mg twice daily.  As his symptoms have not resolved we will also obtain stat EEG, I am concerned for breakthrough seizure and the  possibility of focal status epilepticus  On review of head CT, SDH is smaller than that reported on 10/28/2022 CT report from [place] (1.8 cm thick, 9 mm rightward shift) but I am unable to fully review records from [place] and unsure if he has had more recent scans  Recommendations: - Appreciate neurosurgical consult for subdural hematoma management - BP Systolic [id number] for now pending neurosurgical eval  - Keppra 2000 mg loading dose followed by 750 mg twice daily given his renal function Estimated Creatinine Clearance: 33.4 mL/min (A) (by C-G formula based on SCr of 1.8 mg/dL (H)).   CrCl 30 to <50 mL/minute/1.73 m2: 250 to 750 mg every 12 hours. - No antiplatelets due to [place], defer recommendations regarding DVT prophylaxis to neurosurgery - Stat EEG - Neurology will follow along  Patient seen and examined by NP/APP with MD. MD to update note as needed.   Janine Ores, DNP, FNP-BC [facility] Pager: [phone]  Attending Neurologist's note:  I personally saw this patient, gathering history, performing a full neurologic examination, reviewing relevant labs, personally reviewing relevant imaging including Head CT, and formulated the assessment and plan, adding the note above for completeness and clarity to accurately reflect my thoughts   Lesleigh Noe MD-PhD [facility] [phone]   CRITICAL CARE Performed by: Lorenza Chick   Total critical care time: 50 minutes  Critical care time was exclusive of separately billable procedures and treating other patients.  Critical care was necessary to treat or prevent imminent  or life-threatening deterioration.  Critical care was time spent personally by me on the following activities: development of treatment plan with patient and/or surrogate as well as nursing, discussions with consultants, evaluation of patient's response to treatment, examination of patient, obtaining history from patient or surrogate, ordering  and performing treatments and interventions, ordering and review of laboratory studies, ordering and review of radiographic studies, pulse oximetry and re-evaluation of patient's condition.

## 2022-11-17 NOTE — Progress Notes (Signed)
StAt EEG complete - results pending.

## 2022-11-18 ENCOUNTER — Inpatient Hospital Stay (HOSPITAL_COMMUNITY): Payer: No Typology Code available for payment source

## 2022-11-18 ENCOUNTER — Encounter (HOSPITAL_COMMUNITY): Payer: Self-pay | Admitting: Family Medicine

## 2022-11-18 DIAGNOSIS — Z515 Encounter for palliative care: Secondary | ICD-10-CM

## 2022-11-18 DIAGNOSIS — Z7189 Other specified counseling: Secondary | ICD-10-CM | POA: Diagnosis not present

## 2022-11-18 DIAGNOSIS — Z8679 Personal history of other diseases of the circulatory system: Secondary | ICD-10-CM | POA: Diagnosis not present

## 2022-11-18 DIAGNOSIS — R569 Unspecified convulsions: Secondary | ICD-10-CM | POA: Diagnosis not present

## 2022-11-18 LAB — CBC
HCT: 36.2 % — ABNORMAL LOW (ref 39.0–52.0)
Hemoglobin: 12.8 g/dL — ABNORMAL LOW (ref 13.0–17.0)
MCH: 31.7 pg (ref 26.0–34.0)
MCHC: 35.4 g/dL (ref 30.0–36.0)
MCV: 89.6 fL (ref 80.0–100.0)
Platelets: 251 10*3/uL (ref 150–400)
RBC: 4.04 MIL/uL — ABNORMAL LOW (ref 4.22–5.81)
RDW: 13.5 % (ref 11.5–15.5)
WBC: 6.6 10*3/uL (ref 4.0–10.5)
nRBC: 0 % (ref 0.0–0.2)

## 2022-11-18 LAB — BASIC METABOLIC PANEL
Anion gap: 9 (ref 5–15)
BUN: 19 mg/dL (ref 8–23)
CO2: 25 mmol/L (ref 22–32)
Calcium: 9.3 mg/dL (ref 8.9–10.3)
Chloride: 103 mmol/L (ref 98–111)
Creatinine, Ser: 1.51 mg/dL — ABNORMAL HIGH (ref 0.61–1.24)
GFR, Estimated: 46 mL/min — ABNORMAL LOW (ref >60)
Glucose, Bld: 105 mg/dL — ABNORMAL HIGH (ref 70–99)
Potassium: 3.9 mmol/L (ref 3.5–5.1)
Sodium: 137 mmol/L (ref 135–145)

## 2022-11-18 LAB — GLUCOSE, CAPILLARY
Glucose-Capillary: 95 mg/dL (ref 70–99)
Glucose-Capillary: 209 mg/dL — ABNORMAL HIGH (ref 70–99)

## 2022-11-18 LAB — CBG MONITORING, ED
Glucose-Capillary: 100 mg/dL — ABNORMAL HIGH (ref 70–99)
Glucose-Capillary: 108 mg/dL — ABNORMAL HIGH (ref 70–99)

## 2022-11-18 LAB — HEMOGLOBIN A1C
Hgb A1c MFr Bld: 6.6 % — ABNORMAL HIGH (ref 4.8–5.6)
Mean Plasma Glucose: 142.72 mg/dL

## 2022-11-18 LAB — MAGNESIUM: Magnesium: 1.6 mg/dL — ABNORMAL LOW (ref 1.7–2.4)

## 2022-11-18 MED ORDER — METOPROLOL TARTRATE 5 MG/5ML IV SOLN: 5.0000 mg | INTRAVENOUS | Status: DC | PRN

## 2022-11-18 MED ORDER — ONDANSETRON HCL 4 MG PO TABS: 4.0000 mg | ORAL_TABLET | Freq: Four times a day (QID) | ORAL | Status: DC | PRN

## 2022-11-18 MED ORDER — ONDANSETRON HCL 4 MG/2ML IJ SOLN: 4.0000 mg | Freq: Four times a day (QID) | INTRAMUSCULAR | Status: DC | PRN

## 2022-11-18 MED ORDER — INSULIN ASPART 100 UNIT/ML IJ SOLN
0.0000 [IU] | INTRAMUSCULAR | Status: DC
  Administered 2022-11-18: 5 [IU] via SUBCUTANEOUS
  Administered 2022-11-19: 3 [IU] via SUBCUTANEOUS
  Administered 2022-11-19: 2 [IU] via SUBCUTANEOUS

## 2022-11-18 MED ORDER — LORAZEPAM 2 MG/ML IJ SOLN: 2.0000 mg | Freq: Once | INTRAMUSCULAR | Status: DC | PRN

## 2022-11-18 MED ORDER — MAGNESIUM SULFATE 2 GM/50ML IV SOLN
2.0000 g | Freq: Once | INTRAVENOUS | Status: AC
  Administered 2022-11-18: 2 g via INTRAVENOUS
  Filled 2022-11-18: qty 50

## 2022-11-18 MED ORDER — ACETAMINOPHEN 650 MG RE SUPP: 650.0000 mg | Freq: Four times a day (QID) | RECTAL | Status: DC | PRN

## 2022-11-18 MED ORDER — LACTATED RINGERS IV SOLN: INTRAVENOUS | Status: AC

## 2022-11-18 MED ORDER — ACETAMINOPHEN 325 MG PO TABS: 650.0000 mg | ORAL_TABLET | Freq: Four times a day (QID) | ORAL | Status: DC | PRN

## 2022-11-18 MED ORDER — INSULIN GLARGINE-YFGN 100 UNIT/ML ~~LOC~~ SOLN
15.0000 [IU] | Freq: Every day | SUBCUTANEOUS | Status: DC
  Administered 2022-11-18: 15 [IU] via SUBCUTANEOUS
  Filled 2022-11-18 (×2): qty 0.15

## 2022-11-18 NOTE — Consult Note (Signed)
Palliative Medicine Inpatient Consult Note  Consulting Provider:  Aline August, MD   Reason for consult:   [place] Palliative Medicine Consult  Reason for Consult? goals of care   11/18/2022  HPI:  Per intake H&P --> 82 year old male with past medical history for recent SDH treated at the [place] with bur hole drainage, hypertension, hyperlipidemia, diabetes mellitus type 2, CKD stage IIIa presented with right-sided weakness and difficulty speaking.  On presentation, speech was garbled and difficult to understand with right-sided weakness.  Code stroke was called.  CT head without contrast showed mixed density 1.6 cm thick left cerebral convexity subdural hematoma with approximately 6 mm rightward shift.  Neurology and neurosurgery were consulted.  He was started on IV Keppra for concern for seizures along with Cleviprex drip.  Neurosurgery recommended conservative management.  EEG was ordered.   Palliative care has been asked to get involved to further address goals of care.  Clinical Assessment/Goals of Care:  *Please note that this is a verbal dictation therefore any spelling or grammatical errors are due to the "[place] [facility]" system interpretation.  I have reviewed medical records including EPIC notes, labs and imaging, received report from bedside RN, assessed the patient who is resting in bed fully alert and oriented.    I met with Alex Franco to further discuss diagnosis prognosis, GOC, EOL wishes, disposition and options.   I introduced Palliative Medicine as specialized medical care for people living with serious illness. It focuses on providing relief from the symptoms and stress of a serious illness. The goal is to improve quality of life for both the patient and the family.  Medical History Review and Understanding:  Discussed patients past medical history of HTN, HLD, DM II, CKD, and most recent SDH.   Social History:  Alex Franco shares that he is from  [place] originally though lived for period of time in [place].  He served 6-1/2 years in the [employer].  He had an honorable discharge.  He later worked for [employer] where he retired from.  He has been married twice and has 5 children that he "claims".  He presently has a long-term girlfriend, Alex Franco.  He shares that he is a man of faith and was praised within the [facility] denomination.  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  In addition to that he also was a Management consultant for 9-1/2 years.  He has a slingshot vehicle which she enjoys cruising around town.  Functional and Nutritional State:  Preceding patient's subdural hematoma he was able to perform all basic activities of daily living independently.  Advance Directives:  A detailed discussion was had today regarding advanced directives.  Patient shares that he does have advanced directives and he has previously designated his long term girlfriend, Alex Franco as his Media planner.   Code Status:  Concepts specific to code status, artifical feeding and hydration, continued IV antibiotics and rehospitalization was had.  The difference between a aggressive medical intervention path  and a palliative comfort care path for this patient at this time was had.   Encouraged patient/family to consider DNR/DNI status understanding evidenced based poor outcomes in similar hospitalized patient, as the cause of arrest is likely associated with advanced chronic/terminal illness rather than an easily reversible acute cardio-pulmonary event. I explained that DNR/DNI does not change the medical plan and it only comes into effect after a person has arrested (died).  It is a protective  measure to keep [place] from harming the patient in their last moments of life. [place] was agreeable to DNR/DNI with understanding that patient would not receive CPR, defibrillation, ACLS medications, or intubation.   Discussion:  A review of Alex Franco's  present reason for hospitalization was held. We reviewed his prior subdural hematoma and ongoing risks of seizures.  We discussed the importance of continued antiepileptic therapy.  We reviewed per notes he has not had any additional seizure activity and for the most part he appears to be in stable condition.  We further discussed the recommendations for here inclusive of physical therapy, Occupational Therapy, speech therapy.  Discussed the importance of continued conversation with family and their  medical providers regarding overall plan of care and treatment options, ensuring decisions are within the context of the patients values and GOCs. ___________________________ Addendum:  I went back to bedside later in the afternoon. Reviewed with patient and his long-time GF of four years, [name] the above.  A copy of the HCPOA documentation was obtained.  Confirmed DNAR/DNI.  Goals for improvement.   Decision Maker: [name] Significant other   [phone] HCPOA   SUMMARY OF RECOMMENDATIONS   DNAR/DNI  HCPOA documents obtained and will be scanned into [employer]  Goals are clear for improvement  PMT will continue to conduct incremental chart checks, and re-involve if warranted  Code Status/Advance Care Planning: DNAR/DNI   Palliative Prophylaxis:  Aspiration, Bowel Regimen, Delirium Protocol, Frequent Pain Assessment, Oral Care, Palliative Wound Care, and Turn Reposition  Additional Recommendations (Limitations, Scope, Preferences): Continue current scope of care  Psycho-social/Spiritual:  Desire for further Chaplaincy support: Not presently Additional Recommendations: Education on subdural hematomas and seizures associated with these   Prognosis: Multiple chronic comorbidities and recent hospitalization(s).  Patient well-functioning proceeding that.  Unclear at this time though based upon age and disease burden 93-monthmortality risk is elevated.  Discharge Planning:  Discharge per primary team.  Vitals:   11/18/22 1200 11/18/22 1300  BP: 132/70 (!) 125/52  Pulse: 70 75  Resp:  18  Temp:  97.9 F (36.6 C)  SpO2: 99%    No intake or output data in the 24 hours ending 11/18/22 1349 Last Weight  Most recent update: 11/17/2022  4:25 PM    Weight  97.6 kg (215 lb 2.7 oz)            Gen: Elderly African-American male in no acute distress HEENT: moist mucous membranes CV: Regular rate and rhythm PULM: On room air breathing is even and nonlabored ABD: soft/nontender EXT: No edema Neuro: Alert and oriented x3, (+) dysarthria  PPS: 60%   This conversation/these recommendations were discussed with patient primary care team, Dr. AStarla Link  Total Time: 951Billing based on MDM: High  Problems Addressed: One acute or chronic illness or injury that poses a threat to life or bodily function  Amount and/or Complexity of Data: Category 3:Discussion of management or test interpretation with external physician/other qualified health care professional/appropriate source (not separately reported)  Risks: Decision regarding hospitalization or escalation of hospital care and Decision not to resuscitate or to de-escalate care because of poor prognosis ______________________________________________________ [place] Cell Phone: [phone] utilize secure chat with additional questions, if there is no response within 30 minutes please call the above phone number  Palliative Medicine Team providers are available by phone from 7am to 7pm daily and can be reached through the team cell phone.  Should this patient require assistance  outside of these hours, please call the patient's attending physician.

## 2022-11-18 NOTE — Progress Notes (Signed)
LTM EEG hooked up and running - no initial skin breakdown - push button tested - Not monitored by [facility] due to current location.

## 2022-11-18 NOTE — ED Notes (Signed)
ED TO INPATIENT HANDOFF REPORT  ED Nurse Name and Phone #: [phone]  S Name/Age/Gender Alex [place] 82 y.o. male Room/Bed: 037C/037C  Code Status   Code Status: Full Code  Home/SNF/Other Home Patient oriented to: self, place, and time Is this baseline? Yes   Triage Complete: Triage complete  Chief Complaint Seizure [facility]) [R56.9]  Triage Note Pt arrived via [facility] for code stroke from home. Pt was out with wife this afternoon and was at baseline (A&Ox4, GCS 15, clear speech, ambulatory without assist device, very mild right sided weakness from stroke (hem) last month). Upon arriving home (1520) wife noted difficulty ambulating, slurred speech and called 911. EMS note aphasia and mild right sided weakness. 20g IV RH.   PTA EMS Vitals BP 180/100 HR 90 SPO2 97% RA RR 20 CBG 98   Allergies No Active Allergies  Level of Care/Admitting Diagnosis ED Disposition     ED Disposition  Admit   Condition  --   Comment  Hospital Area: [place] [[id number]]  Level of Care: Telemetry Medical [104]  May admit patient to [facility] or [facility] if equivalent level of care is available:: No  Covid Evaluation: Confirmed COVID Negative  Diagnosis: Seizure ([place]) [id number]  Admitting Physician: Haywood Pao  Attending Physician: Quintella Baton [id number]  Certification:: I certify this patient will need inpatient services for at least 2 midnights  Estimated Length of Stay: 2          B Medical/Surgery History Past Medical History:  Diagnosis Date   Diabetes mellitus without complication ([place])    Hypercholesterolemia    Hypertension    Past Surgical History:  Procedure Laterality Date   KNEE SURGERY       A IV Location/Drains/Wounds Patient Lines/Drains/Airways Status     Active Line/Drains/Airways     Name Placement date Placement time Site Days   Peripheral IV 11/17/22 20 G Posterior;Right Hand 11/17/22  1641  Hand  1   Peripheral IV 11/17/22  18 G Anterior;Left;Proximal Forearm 11/17/22  1642  Forearm  1            Intake/Output Last 24 hours No intake or output data in the 24 hours ending 11/18/22 1156  Labs/Imaging Results for orders placed or performed during the hospital encounter of 11/17/22 (from the past 48 hour(s))  CBG monitoring, ED     Status: Abnormal   Collection Time: 11/17/22  4:17 PM  Result Value Ref Range   Glucose-Capillary 103 (H) 70 - 99 mg/dL    Comment: Glucose reference range applies only to samples taken after fasting for at least 8 hours.  Ethanol     Status: None   Collection Time: 11/17/22  4:18 PM  Result Value Ref Range   Alcohol, Ethyl (B) <10 <10 mg/dL    Comment: (NOTE) Lowest detectable limit for serum alcohol is 10 mg/dL.  For medical purposes only. Performed at [facility], [place] [address], [place], [place] [address]   Urine rapid drug screen (hosp performed)     Status: None   Collection Time: 11/17/22  4:18 PM  Result Value Ref Range   Opiates NONE DETECTED NONE DETECTED   Cocaine NONE DETECTED NONE DETECTED   Benzodiazepines NONE DETECTED NONE DETECTED   Amphetamines NONE DETECTED NONE DETECTED   Tetrahydrocannabinol NONE DETECTED NONE DETECTED   Barbiturates NONE DETECTED NONE DETECTED    Comment: (NOTE) DRUG SCREEN FOR MEDICAL PURPOSES ONLY.  IF CONFIRMATION IS NEEDED FOR  ANY PURPOSE, NOTIFY LAB WITHIN 5 DAYS.  LOWEST DETECTABLE LIMITS FOR URINE DRUG SCREEN Drug Class                     Cutoff (ng/mL) Amphetamine and metabolites    1000 Barbiturate and metabolites    200 Benzodiazepine                 200 Opiates and metabolites        300 Cocaine and metabolites        300 THC                            50 Performed at [facility], [place] [address], [place], [place] [address]   Urinalysis, Routine w reflex microscopic -Urine, Clean Catch     Status: None   Collection Time: 11/17/22  4:18 PM  Result Value Ref Range   Color, Urine YELLOW YELLOW    APPearance CLEAR CLEAR   Specific Gravity, Urine 1.015 1.005 - 1.030   pH 6.0 5.0 - 8.0   Glucose, UA NEGATIVE NEGATIVE mg/dL   Hgb urine dipstick NEGATIVE NEGATIVE   Bilirubin Urine NEGATIVE NEGATIVE   Ketones, ur NEGATIVE NEGATIVE mg/dL   Protein, ur NEGATIVE NEGATIVE mg/dL   Nitrite NEGATIVE NEGATIVE   Leukocytes,Ua NEGATIVE NEGATIVE    Comment: Performed at [place] [address] [place], [place] [address]  Protime-INR     Status: None   Collection Time: 11/17/22  4:25 PM  Result Value Ref Range   Prothrombin Time 13.4 11.4 - 15.2 seconds   INR 1.0 0.8 - 1.2    Comment: (NOTE) INR goal varies based on device and disease states. Performed at [facility], [place] [address] [place], [place] [address]   APTT     Status: None   Collection Time: 11/17/22  4:25 PM  Result Value Ref Range   aPTT 29 24 - 36 seconds    Comment: Performed at [place] [address], [place] [address]  CBC     Status: None   Collection Time: 11/17/22  4:25 PM  Result Value Ref Range   WBC 7.2 4.0 - 10.5 K/uL   RBC 4.39 4.22 - 5.81 MIL/uL   Hemoglobin 13.6 13.0 - 17.0 g/dL   HCT 40.0 39.0 - 52.0 %   MCV 91.1 80.0 - 100.0 fL   MCH 31.0 26.0 - 34.0 pg   MCHC 34.0 30.0 - 36.0 g/dL   RDW 13.6 11.5 - 15.5 %   Platelets 255 150 - 400 K/uL   nRBC 0.0 0.0 - 0.2 %    Comment: Performed at [facility], [place] [address], [place],  [address]  Differential     Status: None   Collection Time: 11/17/22  4:25 PM  Result Value Ref Range   Neutrophils Relative % 56 %   Neutro Abs 4.1 1.7 - 7.7 K/uL   Lymphocytes Relative 34 %   Lymphs Abs 2.4 0.7 - 4.0 K/uL   Monocytes Relative 9 %   Monocytes Absolute 0.6 0.1 - 1.0 K/uL   Eosinophils Relative 1 %   Eosinophils Absolute 0.0 0.0 - 0.5 K/uL   Basophils Relative 0 %   Basophils Absolute 0.0 0.0 - 0.1 K/uL   Immature Granulocytes 0 %   Abs Immature Granulocytes 0.02 0.00 - 0.07 K/uL    Comment: Performed  at  [facility], [place] [address] [place], [place] [address]  Comprehensive metabolic panel     Status: Abnormal   Collection Time: 11/17/22  4:25 PM  Result Value Ref Range   Sodium 137 135 - 145 mmol/L   Potassium 4.2 3.5 - 5.1 mmol/L   Chloride 103 98 - 111 mmol/L   CO2 21 (L) 22 - 32 mmol/L   Glucose, Bld 106 (H) 70 - 99 mg/dL    Comment: Glucose reference range applies only to samples taken after fasting for at least 8 hours.   BUN 21 8 - 23 mg/dL   Creatinine, Ser 1.68 (H) 0.61 - 1.24 mg/dL   Calcium 9.8 8.9 - 10.3 mg/dL   Total Protein 7.1 6.5 - 8.1 g/dL   Albumin 3.9 3.5 - 5.0 g/dL   AST 19 15 - 41 U/L   ALT 15 0 - 44 U/L   Alkaline Phosphatase 107 38 - 126 U/L   Total Bilirubin 0.9 0.3 - 1.2 mg/dL   GFR, Estimated 40 (L) >60 mL/min    Comment: (NOTE) Calculated using the CKD-EPI Creatinine Equation (2021)    Anion gap 13 5 - 15    Comment: Performed at [facility] [address] [place], [place] [address]  I-stat chem 8, ED     Status: Abnormal   Collection Time: 11/17/22  4:30 PM  Result Value Ref Range   Sodium 136 135 - 145 mmol/L   Potassium 4.4 3.5 - 5.1 mmol/L   Chloride 108 98 - 111 mmol/L   BUN 24 (H) 8 - 23 mg/dL   Creatinine, Ser 1.80 (H) 0.61 - 1.24 mg/dL   Glucose, Bld 103 (H) 70 - 99 mg/dL    Comment: Glucose reference range applies only to samples taken after fasting for at least 8 hours.   Calcium, Ion 0.97 (L) 1.15 - 1.40 mmol/L   TCO2 21 (L) 22 - 32 mmol/L   Hemoglobin 14.3 13.0 - 17.0 g/dL   HCT 42.0 39.0 - 52.0 %  Hemoglobin A1c     Status: Abnormal   Collection Time: 11/18/22  5:45 AM  Result Value Ref Range   Hgb A1c MFr Bld 6.6 (H) 4.8 - 5.6 %    Comment: (NOTE) Pre diabetes:          5.7%-6.4%  Diabetes:              >6.4%  Glycemic control for   <7.0% adults with diabetes    Mean Plasma Glucose 142.72 mg/dL    Comment: Performed at [facility], [place] [address], [place], [place] [id number]  Basic metabolic panel      Status: Abnormal   Collection Time: 11/18/22  5:45 AM  Result Value Ref Range   Sodium 137 135 - 145 mmol/L   Potassium 3.9 3.5 - 5.1 mmol/L   Chloride 103 98 - 111 mmol/L   CO2 25 22 - 32 mmol/L   Glucose, Bld 105 (H) 70 - 99 mg/dL    Comment: Glucose reference range applies only to samples taken after fasting for at least 8 hours.   BUN 19 8 - 23 mg/dL   Creatinine, Ser 1.51 (H) 0.61 - 1.24 mg/dL   Calcium 9.3 8.9 - 10.3 mg/dL   GFR, Estimated 46 (L) >60 mL/min    Comment: (NOTE) Calculated using the CKD-EPI Creatinine Equation (2021)    Anion gap 9 5 - 15    Comment: Performed at  [facility], [place] [address] [place], [place] [address]  Magnesium     Status: Abnormal   Collection Time: 11/18/22  5:45 AM  Result Value Ref Range   Magnesium 1.6 (L) 1.7 - 2.4 mg/dL    Comment: Performed at [place] [address] [place], [place] [address]  CBC     Status: Abnormal   Collection Time: 11/18/22  5:45 AM  Result Value Ref Range   WBC 6.6 4.0 - 10.5 K/uL   RBC 4.04 (L) 4.22 - 5.81 MIL/uL   Hemoglobin 12.8 (L) 13.0 - 17.0 g/dL   HCT 36.2 (L) 39.0 - 52.0 %   MCV 89.6 80.0 - 100.0 fL   MCH 31.7 26.0 - 34.0 pg   MCHC 35.4 30.0 - 36.0 g/dL   RDW 13.5 11.5 - 15.5 %   Platelets 251 150 - 400 K/uL   nRBC 0.0 0.0 - 0.2 %    Comment: Performed at [facility], [place] [address], [place], [place] [address]  CBG monitoring, ED     Status: Abnormal   Collection Time: 11/18/22  5:47 AM  Result Value Ref Range   Glucose-Capillary 100 (H) 70 - 99 mg/dL    Comment: Glucose reference range applies only to samples taken after fasting for at least 8 hours.  CBG monitoring, ED     Status: Abnormal   Collection Time: 11/18/22  8:03 AM  Result Value Ref Range   Glucose-Capillary 108 (H) 70 - 99 mg/dL    Comment: Glucose reference range applies only to samples taken after fasting for at least 8 hours.   Overnight EEG with video  Result Date: 11/18/2022 Lora Havens, MD      11/18/2022  9:48 AM Patient Name: JEM EASTBURN MRN: [id number] Epilepsy Attending: Lora Havens Referring Physician/Provider: Kerney Elbe, MD Duration: 11/18/2022 [id number] to 11/18/2022 0945  Patient history: 82 y.o. male presenting with a possible worsening of recent traumatic subdural hematoma s/p bur hole done through the [place] presenting wth seizure. EEG to evaluate for seizure.  Level of alertness: Awake, asleep  AEDs during EEG study: LEV  Technical aspects: This EEG study was done with scalp electrodes positioned according to the 10-20 International system of electrode placement. Electrical activity was reviewed with band pass filter of 1-'70Hz'$ , sensitivity of 7 uV/mm, display speed of 44m/sec with a '60Hz'$  notched filter applied as appropriate. EEG data were recorded continuously and digitally stored.  Video monitoring was available and reviewed as appropriate.  Description: The posterior dominant rhythm consists of 8 Hz activity of moderate voltage (25-35 uV) seen predominantly in posterior head regions, symmetric and reactive to eye opening and eye closing. Sleep was characterized by vertex waves, sleep spindles (12 to 14 Hz), maximal frontocentral region. EEG showed intermittent 3 to 5 Hz theta-delta slowing in left fronto-temporal region. Hyperventilation and photic stimulation were not performed.    ABNORMALITY - Intermittent slow, left fronto-temporal region  IMPRESSION: This study is suggestive of cortical dysfunction arising from left fronto-temporal region likely secondary to underlying SDH. No seizures or epileptiform discharges were seen throughout the recording.  Please note that lack of epileptiform activity during  interictal EEG does not exclude  the diagnosis of epilepsy.  PLora Havens   EEG adult  Result Date: 11/17/2022 YLora Havens MD     11/18/2022  9:45 AM Patient Name: AGIO LOCKEMRN: [id number] Attending: PLora HavensReferring Physician/Provider: BLesleigh Noe  L, MD Date: 11/17/2022 Duration: 25.23 mins Patient history: 82 y.o. male presenting with a possible worsening of recent traumatic subdural hematoma s/p bur hole done through the [place] presenting wth seizure. EEG to evaluate for seizure. Level of alertness: Awake, asleep AEDs during EEG study: LEV Technical aspects: This EEG study was done with scalp electrodes positioned according to the 10-20 International system of electrode placement. Electrical activity was reviewed with band pass filter of 1-'70Hz'$ , sensitivity of 7 uV/mm, display speed of 31m/sec with a '60Hz'$  notched filter applied as appropriate. EEG data were recorded continuously and digitally stored.  Video monitoring was available and reviewed as appropriate. Description: The posterior dominant rhythm consists of 8 Hz activity of moderate voltage (25-35 uV) seen predominantly in posterior head regions, symmetric and reactive to eye opening and eye closing. Sleep was characterized by vertex waves, sleep spindles (12 to 14 Hz), maximal frontocentral region. EEG showed intermittent 3 to 5 Hz theta-delta slowing in left fronto-temporal region. Hyperventilation and photic stimulation were not performed.   ABNORMALITY - Intermittent slow, left fronto-temporal region IMPRESSION: This study is suggestive of cortical dysfunction arising from left fronto-temporal region likely secondary to underlying SDH. No seizures or epileptiform discharges were seen throughout the recording. Please note that lack of epileptiform activity during  interictal EEG does not exclude  the diagnosis of epilepsy. PLora Havens  CT HEAD CODE STROKE WO CONTRAST  Result Date: 11/17/2022 CLINICAL DATA:  Code stroke.  Neuro deficit, acute, stroke suspected EXAM: CT HEAD WITHOUT CONTRAST TECHNIQUE: Contiguous axial images were obtained from the base of the skull through the vertex without intravenous contrast. RADIATION DOSE REDUCTION: This exam was performed according to the departmental  dose-optimization program which includes automated exposure control, adjustment of the mA and/or kV according to patient size and/or use of iterative reconstruction technique. COMPARISON:  None Available. FINDINGS: Brain: Mixed density 1.6 cm thick left cerebral convexity subdural hematoma. Areas of hyperdensity within this hematoma are compatible with acute/recent hemorrhage. Approximately 6 mm of rightward shift. No evidence of acute large vascular territory infarct. Patchy white matter hypodensities, nonspecific but compatible with chronic microvascular ischemic disease. No evidence of mass lesion or hydrocephalus. Partially effaced left lateral ventricle. Vascular: No hyperdense vessel identified. Skull: Left-sided burr holes.  No acute fracture. Sinuses/Orbits: Remote appearing left medial orbital wall fracture. Clear sinuses. Other: No mastoid effusions. IMPRESSION: Mixed density 1.6 cm thick left cerebral convexity subdural hematoma. Areas of hyperdensity within this hematoma are compatible with acute/recent hemorrhage. Approximately 6 mm of rightward shift. Code stroke imaging results were communicated on 11/17/2022 at 4:30 pm to provider Bhagat via telephone, who verbally acknowledged these results. Electronically Signed   By: FMargaretha SheffieldM.D.   On: 11/17/2022 16:34    Pending Labs Unresulted Labs (From admission, onward)     Start     Ordered   11/19/22 0500  CBC with Differential/Platelet  Tomorrow morning,   R        11/18/22 1128   11/19/22 [id number] Basic metabolic panel  Tomorrow morning,   R        11/18/22 1128   11/19/22 0500  Magnesium  Tomorrow morning,   R        11/18/22 1128            Vitals/Pain Today's Vitals   11/18/22 1000 11/18/22 1015 11/18/22 1030 11/18/22 1045  BP: (!) 137/48 (!) 151/78 (!) 155/79 132/69  Pulse: 63 78 81 64  Resp: 16 13  14 (!) 24  Temp:      TempSrc:      SpO2: 98% 100% 100% 100%  Weight:      Height:      PainSc:        Isolation  Precautions No active isolations  Medications Medications  levETIRAcetam (KEPPRA) 750 mg in sodium chloride 0.9 % 100 mL IVPB (0 mg Intravenous Stopped 11/18/22 1100)  insulin aspart (novoLOG) injection 0-15 Units ( Subcutaneous Not Given 11/18/22 0804)  insulin glargine-yfgn (SEMGLEE) injection 15 Units (has no administration in time range)  acetaminophen (TYLENOL) tablet 650 mg (has no administration in time range)    Or  acetaminophen (TYLENOL) suppository 650 mg (has no administration in time range)  ondansetron (ZOFRAN) tablet 4 mg (has no administration in time range)    Or  ondansetron (ZOFRAN) injection 4 mg (has no administration in time range)  LORazepam (ATIVAN) injection 2 mg (has no administration in time range)  lactated ringers infusion ( Intravenous New Bag/Given 11/18/22 0702)  magnesium sulfate IVPB 2 g 50 mL (has no administration in time range)  levETIRAcetam (KEPPRA) IVPB 1000 mg/100 mL premix (0 mg Intravenous Stopped 11/17/22 1722)    Mobility manual wheelchair     Focused Assessments Cardiac Assessment Handoff:  Cardiac Rhythm: Normal sinus rhythm Lab Results  Component Value Date   CKTOTAL 329 (H) 04/21/2011   CKMB 4.3 (H) 04/21/2011   TROPONINI <0.30 04/21/2011   Lab Results  Component Value Date   DDIMER 0.48 04/20/2011   Does the Patient currently have chest pain? No   , Neuro Assessment Handoff:  Swallow screen pass?    Cardiac Rhythm: Normal sinus rhythm NIH Stroke Scale  Dizziness Present: No Headache Present: No Interval: Shift assessment Level of Consciousness (1a.)   : Alert, keenly responsive LOC Questions (1b. )   : Answers both questions correctly LOC Commands (1c. )   : Performs both tasks correctly Best Gaze (2. )  : Normal Visual (3. )  : Partial hemianopia Facial Palsy (4. )    : Normal symmetrical movements Motor Arm, Left (5a. )   : No drift Motor Arm, Right (5b. ) : No drift Motor Leg, Left (6a. )  : No drift Motor Leg,  Right (6b. ) : No drift Limb Ataxia (7. ): Absent Sensory (8. )  : Normal, no sensory loss Best Language (9. )  : Mild-to-moderate aphasia Dysarthria (10. ): Mild-to-moderate dysarthria, patient slurs at least some words and, at worst, can be understood with some difficulty Extinction/Inattention (11.)   : No Abnormality Complete NIHSS TOTAL: 3 Last date known well: 11/17/22 Last time known well: 1500 Neuro Assessment: Exceptions to WDL Neuro Checks:   Initial (11/17/22 1630)  Has TPA been given? No If patient is a Neuro Trauma and patient is going to OR before floor call report to [place] nurse: [phone] or [phone]  , Renal Assessment Handoff: , Pulmonary Assessment Handoff:  Lung sounds: L Breath Sounds: Clear R Breath Sounds: Clear O2 Device: Room Air      R Recommendations: See Admitting Provider Note  Report given to:   Additional Notes:  q2 NIH

## 2022-11-18 NOTE — Procedures (Signed)
Patient Name: [place]  MRN: [id number]  Epilepsy Attending: Lora Havens  Referring Physician/Provider: Kerney Elbe, MD  Duration: 11/18/2022 [id number] to 11/18/2022 1256   Patient history: 82 y.o. male presenting with a possible worsening of recent traumatic subdural hematoma s/p bur hole done through the [place] presenting wth seizure. EEG to evaluate for seizure.   Level of alertness: Awake, asleep   AEDs during EEG study: LEV   Technical aspects: This EEG study was done with scalp electrodes positioned according to the 10-20 International system of electrode placement. Electrical activity was reviewed with band pass filter of 1-'70Hz'$ , sensitivity of 7 uV/mm, display speed of 79m/sec with a '60Hz'$  notched filter applied as appropriate. EEG data were recorded continuously and digitally stored.  Video monitoring was available and reviewed as appropriate.   Description: The posterior dominant rhythm consists of 8 Hz activity of moderate voltage (25-35 uV) seen predominantly in posterior head regions, symmetric and reactive to eye opening and eye closing. Sleep was characterized by vertex waves, sleep spindles (12 to 14 Hz), maximal frontocentral region. EEG showed intermittent 3 to 5 Hz theta-delta slowing in left fronto-temporal region. Hyperventilation and photic stimulation were not performed.      ABNORMALITY - Intermittent slow, left fronto-temporal region   IMPRESSION: This study is suggestive of cortical dysfunction arising from left fronto-temporal region likely secondary to underlying SDH. No seizures or epileptiform discharges were seen throughout the recording.   Please note that lack of epileptiform activity during  interictal EEG does not exclude  the diagnosis of epilepsy.   Perkins Molina OBarbra Sarks

## 2022-11-18 NOTE — Progress Notes (Signed)
PROGRESS NOTE    Alex Franco  [id number] DOB: 31-Dec-1940 DOA: 11/17/2022 PCP: [facility]   Brief Narrative:  82 year old male with past medical history for recent SDH treated at the [place] with bur hole drainage, hypertension, hyperlipidemia, diabetes mellitus type 2, CKD stage IIIa presented with right-sided weakness and difficulty speaking.  On presentation, speech was garbled and difficult to understand with right-sided weakness.  Code stroke was called.  CT head without contrast showed mixed density 1.6 cm thick left cerebral convexity subdural hematoma with approximately 6 mm rightward shift.  Neurology and neurosurgery were consulted.  He was started on IV Keppra for concern for seizures along with Cleviprex drip.  Neurosurgery recommended conservative management.  EEG was ordered.  Assessment & Plan:   Seizures History of recent SDH with bur hole treatment -Was not on any antiseizure medications currently at home.  Presented with possible seizure.  Currently on IV Keppra as per neurology recommendations.  EEG ongoing. -Fall precautions.  Seizure precautions.  Monitor mental status.  SLP evaluation -Neurosurgery recommended conservative management and outpatient follow-up with [facility] neurosurgery.  Patient was empirically started on Cleviprex drip: Since this is not acute SDH, will DC Cleviprex drip.  Diabetes mellitus type 2 -A1c 6.6.  Continue CBGs with SSI and long-acting insulin  Hypertension.  Monitor blood pressure.  Currently on Cleviprex drip.  DC cefadroxil.  Monitor blood pressure  Hyperlipidemia -Resume statin once able to tolerate orally  CKD stage IIIa -Creatinine currently stable.  Hypomagnesemia Replace.  Repeat a.m. labs  Obesity -Outpatient follow-up  Physical deconditioning -PT eval  Goals of care -Consult palliative care for goals of care discussion.  Currently listed as full code.  DVT prophylaxis: SCDs Code Status: Full Family  Communication: Wife at bedside Disposition Plan: Status is: Inpatient Remains inpatient appropriate because: Of severity of illness.  Consultants: Neurology Procedures: EEG  Antimicrobials: None   Subjective: Patient seen and examined at bedside.  Sleepy, wakes up slightly, slow to respond.  Wife present at bedside.  No fever, vomiting, agitation reported.  Objective: Vitals:   11/18/22 1000 11/18/22 1015 11/18/22 1030 11/18/22 1045  BP: (!) 137/48 (!) 151/78 (!) 155/79 132/69  Pulse: 63 78 81 64  Resp: '16 13 14 '$ (!) 24  Temp:      TempSrc:      SpO2: 98% 100% 100% 100%  Weight:      Height:       No intake or output data in the 24 hours ending 11/18/22 1118 Filed Weights   11/17/22 1625  Weight: 97.6 kg    Examination:  General exam: Appears calm and comfortable.  Looks chronically ill and deconditioned.  Elderly male lying in bed.  On room air. Respiratory system: Bilateral decreased breath sounds at bases with scattered crackles, intermittent tachypnea Cardiovascular system: S1 & S2 heard, Rate controlled Gastrointestinal system: Abdomen is obese, nondistended, soft and nontender. Normal bowel sounds heard. Extremities: No cyanosis, clubbing, edema  Central nervous system: Wakes up slightly, slow to respond, poor historian.  No focal neurological deficits. Moving extremities Skin: No rashes, lesions or ulcers Psychiatry: Unable to assess because of mental status.    Data Reviewed: I have personally reviewed following labs and imaging studies  CBC: Recent Labs  Lab 11/17/22 1625 11/17/22 1630 11/18/22 0545  WBC 7.2  --  6.6  NEUTROABS 4.1  --   --   HGB 13.6 14.3 12.8*  HCT 40.0 42.0 36.2*  MCV 91.1  --  89.6  PLT 255  --  [id number]   Basic Metabolic Panel: Recent Labs  Lab 11/17/22 1625 11/17/22 1630 11/18/22 0545  NA 137 136 137  K 4.2 4.4 3.9  CL 103 108 103  CO2 21*  --  25  GLUCOSE 106* 103* 105*  BUN 21 24* 19  CREATININE 1.68* 1.80* 1.51*   CALCIUM 9.8  --  9.3  MG  --   --  1.6*   GFR: Estimated Creatinine Clearance: 39.8 mL/min (A) (by C-G formula based on SCr of 1.51 mg/dL (H)). Liver Function Tests: Recent Labs  Lab 11/17/22 1625  AST 19  ALT 15  ALKPHOS 107  BILITOT 0.9  PROT 7.1  ALBUMIN 3.9   No results for input(s): "LIPASE", "AMYLASE" in the last 168 hours. No results for input(s): "AMMONIA" in the last 168 hours. Coagulation Profile: Recent Labs  Lab 11/17/22 1625  INR 1.0   Cardiac Enzymes: No results for input(s): "CKTOTAL", "CKMB", "CKMBINDEX", "TROPONINI" in the last 168 hours. BNP (last 3 results) No results for input(s): "PROBNP" in the last 8760 hours. HbA1C: Recent Labs    11/18/22 0545  HGBA1C 6.6*   CBG: Recent Labs  Lab 11/17/22 1617 11/18/22 0547 11/18/22 0803  GLUCAP 103* 100* 108*   Lipid Profile: No results for input(s): "CHOL", "HDL", "LDLCALC", "TRIG", "CHOLHDL", "LDLDIRECT" in the last 72 hours. Thyroid Function Tests: No results for input(s): "TSH", "T4TOTAL", "FREET4", "T3FREE", "THYROIDAB" in the last 72 hours. Anemia Panel: No results for input(s): "VITAMINB12", "FOLATE", "FERRITIN", "TIBC", "IRON", "RETICCTPCT" in the last 72 hours. Sepsis Labs: No results for input(s): "PROCALCITON", "LATICACIDVEN" in the last 168 hours.  No results found for this or any previous visit (from the past 240 hour(s)).       Radiology Studies: Overnight EEG with video  Result Date: 11/18/2022 Lora Havens, MD     11/18/2022  9:48 AM Patient Name: Alex Franco MRN: [id number] Epilepsy Attending: Lora Havens Referring Physician/Provider: Kerney Elbe, MD Duration: 11/18/2022 [id number] to 11/18/2022 0945  Patient history: 82 y.o. male presenting with a possible worsening of recent traumatic subdural hematoma s/p bur hole done through the [place] presenting wth seizure. EEG to evaluate for seizure.  Level of alertness: Awake, asleep  AEDs during EEG study: LEV  Technical aspects: This EEG  study was done with scalp electrodes positioned according to the 10-20 International system of electrode placement. Electrical activity was reviewed with band pass filter of 1-'70Hz'$ , sensitivity of 7 uV/mm, display speed of 35m/sec with a '60Hz'$  notched filter applied as appropriate. EEG data were recorded continuously and digitally stored.  Video monitoring was available and reviewed as appropriate.  Description: The posterior dominant rhythm consists of 8 Hz activity of moderate voltage (25-35 uV) seen predominantly in posterior head regions, symmetric and reactive to eye opening and eye closing. Sleep was characterized by vertex waves, sleep spindles (12 to 14 Hz), maximal frontocentral region. EEG showed intermittent 3 to 5 Hz theta-delta slowing in left fronto-temporal region. Hyperventilation and photic stimulation were not performed.    ABNORMALITY - Intermittent slow, left fronto-temporal region  IMPRESSION: This study is suggestive of cortical dysfunction arising from left fronto-temporal region likely secondary to underlying SDH. No seizures or epileptiform discharges were seen throughout the recording.  Please note that lack of epileptiform activity during  interictal EEG does not exclude  the diagnosis of epilepsy.  PLora Havens   EEG adult  Result Date: 11/17/2022 YLora Havens MD  11/18/2022  9:45 AM Patient Name: Alex Franco MRN: [id number] Epilepsy Attending: Lora Havens Referring Physician/Provider: Lorenza Chick, MD Date: 11/17/2022 Duration: 25.23 mins Patient history: 82 y.o. male presenting with a possible worsening of recent traumatic subdural hematoma s/p bur hole done through the [place] presenting wth seizure. EEG to evaluate for seizure. Level of alertness: Awake, asleep AEDs during EEG study: LEV Technical aspects: This EEG study was done with scalp electrodes positioned according to the 10-20 International system of electrode placement. Electrical activity was reviewed with  band pass filter of 1-'70Hz'$ , sensitivity of 7 uV/mm, display speed of 80m/sec with a '60Hz'$  notched filter applied as appropriate. EEG data were recorded continuously and digitally stored.  Video monitoring was available and reviewed as appropriate. Description: The posterior dominant rhythm consists of 8 Hz activity of moderate voltage (25-35 uV) seen predominantly in posterior head regions, symmetric and reactive to eye opening and eye closing. Sleep was characterized by vertex waves, sleep spindles (12 to 14 Hz), maximal frontocentral region. EEG showed intermittent 3 to 5 Hz theta-delta slowing in left fronto-temporal region. Hyperventilation and photic stimulation were not performed.   ABNORMALITY - Intermittent slow, left fronto-temporal region IMPRESSION: This study is suggestive of cortical dysfunction arising from left fronto-temporal region likely secondary to underlying SDH. No seizures or epileptiform discharges were seen throughout the recording. Please note that lack of epileptiform activity during  interictal EEG does not exclude  the diagnosis of epilepsy. PLora Havens  CT HEAD CODE STROKE WO CONTRAST  Result Date: 11/17/2022 CLINICAL DATA:  Code stroke.  Neuro deficit, acute, stroke suspected EXAM: CT HEAD WITHOUT CONTRAST TECHNIQUE: Contiguous axial images were obtained from the base of the skull through the vertex without intravenous contrast. RADIATION DOSE REDUCTION: This exam was performed according to the departmental dose-optimization program which includes automated exposure control, adjustment of the mA and/or kV according to patient size and/or use of iterative reconstruction technique. COMPARISON:  None Available. FINDINGS: Brain: Mixed density 1.6 cm thick left cerebral convexity subdural hematoma. Areas of hyperdensity within this hematoma are compatible with acute/recent hemorrhage. Approximately 6 mm of rightward shift. No evidence of acute large vascular territory infarct.  Patchy white matter hypodensities, nonspecific but compatible with chronic microvascular ischemic disease. No evidence of mass lesion or hydrocephalus. Partially effaced left lateral ventricle. Vascular: No hyperdense vessel identified. Skull: Left-sided burr holes.  No acute fracture. Sinuses/Orbits: Remote appearing left medial orbital wall fracture. Clear sinuses. Other: No mastoid effusions. IMPRESSION: Mixed density 1.6 cm thick left cerebral convexity subdural hematoma. Areas of hyperdensity within this hematoma are compatible with acute/recent hemorrhage. Approximately 6 mm of rightward shift. Code stroke imaging results were communicated on 11/17/2022 at 4:30 pm to provider Bhagat via telephone, who verbally acknowledged these results. Electronically Signed   By: FMargaretha SheffieldM.D.   On: 11/17/2022 16:34        Scheduled Meds:  insulin aspart  0-15 Units Subcutaneous Q4H   insulin glargine-yfgn  15 Units Subcutaneous QHS   Continuous Infusions:  clevidipine Stopped (11/18/22 0007)   lactated ringers 75 mL/hr at 11/18/22 0702   levETIRAcetam Stopped (11/18/22 1100)          KAline August MD [facility] 11/18/2022, 11:18 AM

## 2022-11-18 NOTE — Progress Notes (Signed)
SLP Cancellation Note  Patient Details Name: Alex Franco MRN: [id number] DOB: [birth year]   Cancelled treatment:       Reason Eval/Treat Not Completed: Patient declined, no reason specified- Alex Franco refused to accept any food or liquid for swallow assessment. He attempted to climb out of bed, pulling off his gown, asking for clothing, and was polite but irritable and not redirectable.   He was alert with no focal, oral weakness. Recommend allowing POs - if he is observed to demonstrate dysphagia, then order NPO and reconsult for swallowing assessment.  SLP will sign off.  Alex Franco L. Tivis Ringer, MA CCC/SLP Clinical Specialist - Acute Care SLP Acute Rehabilitation Services Office number [phone]    Alex Franco 11/18/2022, 3:08 PM

## 2022-11-18 NOTE — Progress Notes (Deleted)
MB cleared d/c order from Epic

## 2022-11-18 NOTE — Progress Notes (Signed)
LTM EEG discontinued by [facility]- no skin breakdown at [employer]. MB cleared Epic order and made note

## 2022-11-18 NOTE — Progress Notes (Signed)
Neurology Progress Note  Brief HPI: Alex Franco is a 82 y.o. male with a past medical history of HLD, HTN, DM, recent subdural after a fall with burr hole evacuation (at the [place]),  presenting with aphasia and worsening right sided weakness. Patient last noted well around 3 PM today while he was out with his wife.  When he got out of his car around 3:20 PM he was noted to be stumbling and having difficulty with his speech. Medical records show the patient was treated in the hospital at the [facility] system for a large left-sided subdural bleed with bur holes 10/23/2022.  He had repeat CT imaging most recently Oct 28, 2022, there was a measured 1.8 cm left subdural bleed with a 9 mm midline shift.  According to the patient's wife, he did have a seizure when he had his subdural hematoma that included altered mental status and tongue movements -- on further questioning he did have these symptoms 2/22 prior to the worsening weakness.  He had been taken off of his Keppra.  He had previously been on 1000 mg twice daily.    Subjective: Patient seen in room. Received keppra load of '2000mg'$   Exam: Current vital signs: BP 132/70   Pulse 70   Temp 98 F (36.7 C) (Oral)   Resp 18   Ht '5\' 5"'$  (1.651 m)   Wt 97.6 kg   SpO2 99%   BMI 35.81 kg/m  Vital signs in last 24 hours: Temp:  [98 F (36.7 C)-98.3 F (36.8 C)] 98 F (36.7 C) (02/23 0859) Pulse Rate:  [63-106] 70 (02/23 1200) Resp:  [11-24] 18 (02/23 1100) BP: (117-178)/(48-93) 132/70 (02/23 1200) SpO2:  [97 %-100 %] 99 % (02/23 1200) Weight:  [97.6 kg] 97.6 kg (02/22 1625)   Gen: In bed, NAD Resp: non-labored breathing, no acute distress Abd: soft, nt  Neuro: Mental Status: Awake, calmer, able to answer age, month and year correctly.  Following commands, speech much more fluent but still remains mildly dysarthric Cranial Nerves: PERRL mm/brisk. EOMI, face symmetric Motor: 4+ to 5/5 on the right, 5/5 on the left  Pertinent Labs:  Basic Metabolic  Panel: Recent Labs  Lab 11/17/22 1625 11/17/22 1630 11/18/22 0545  NA 137 136 137  K 4.2 4.4 3.9  CL 103 108 103  CO2 21*  --  25  GLUCOSE 106* 103* 105*  BUN 21 24* 19  CREATININE 1.68* 1.80* 1.51*  CALCIUM 9.8  --  9.3  MG  --   --  1.6*    CBC: Recent Labs  Lab 11/17/22 1625 11/17/22 1630 11/18/22 0545  WBC 7.2  --  6.6  NEUTROABS 4.1  --   --   HGB 13.6 14.3 12.8*  HCT 40.0 42.0 36.2*  MCV 91.1  --  89.6  PLT 255  --  251    Coagulation Studies: Recent Labs    11/17/22 1625  LABPROT 13.4  INR 1.0      Imaging Reviewed: CT-head Mixed density 1.6 cm thick left cerebral convexity subdural hematoma. Areas of hyperdensity within this hematoma are compatible with acute/recent hemorrhage. Approximately 6 mm of rightward shift.  Assessment:  82 y.o. male presenting with a possible worsening of recent traumatic subdural hematoma s/p bur hole done through the [place], with recurrent focal seizure in the setting of discontinuing Keppra.  Tolerating reinitiation of Keppra with no further seizures on EEG and exam improved  Recommendations: - Continue keppra '750mg'$  BID; note this is the maximum  dose for his current renal function - Discontinue EEG monitoring - Subdural hematoma reimaging/follow-up per neurosurgery - Include seizure precautions and discharge instructions and review with wife; friend at bedside stated wife's number would be in the chart but unfortunately it is not in the chart - Neurology will be available as needed going forward, please reach out if additional questions or concerns arise.  Recommendations conveyed to primary team via secure chat  Standard seizure precautions: Per [facility] [place] statutes, patients with seizures are not allowed to drive until  they have been seizure-free for six months. Use caution when using heavy equipment or power tools. Avoid working on ladders or at heights. Take showers instead of baths. Ensure the water temperature is  not too high on the home water heater. Do not go swimming alone. When caring for infants or small children, sit down when holding, feeding, or changing them to minimize risk of injury to the child in the event you have a seizure.  To reduce risk of seizures, maintain good sleep hygiene avoid alcohol and illicit drug use, take all anti-seizure medications as prescribed.   Patient seen and examined by NP/APP with MD. MD to update note as needed.   Janine Ores, DNP, FNP-BC [facility] Pager: [phone]  Attending Neurologist's note:  I personally saw this patient, gathering history, performing a full neurologic examination, reviewing relevant labs, personally reviewing relevant imaging including prior head CT, and formulated the assessment and plan, adding the note above for completeness and clarity to accurately reflect my thoughts  Lesleigh Noe MD-PhD [facility] [phone] Available 7 AM to 7 PM, outside these hours please contact Neurologist on call listed on AMION

## 2022-11-19 DIAGNOSIS — Z8679 Personal history of other diseases of the circulatory system: Secondary | ICD-10-CM | POA: Diagnosis not present

## 2022-11-19 DIAGNOSIS — Z515 Encounter for palliative care: Secondary | ICD-10-CM | POA: Diagnosis not present

## 2022-11-19 DIAGNOSIS — Z7189 Other specified counseling: Secondary | ICD-10-CM | POA: Diagnosis not present

## 2022-11-19 DIAGNOSIS — R569 Unspecified convulsions: Secondary | ICD-10-CM | POA: Diagnosis not present

## 2022-11-19 LAB — BASIC METABOLIC PANEL
Anion gap: 10 (ref 5–15)
BUN: 17 mg/dL (ref 8–23)
CO2: 24 mmol/L (ref 22–32)
Calcium: 9.4 mg/dL (ref 8.9–10.3)
Chloride: 104 mmol/L (ref 98–111)
Creatinine, Ser: 1.36 mg/dL — ABNORMAL HIGH (ref 0.61–1.24)
GFR, Estimated: 52 mL/min — ABNORMAL LOW (ref >60)
Glucose, Bld: 99 mg/dL (ref 70–99)
Potassium: 3.9 mmol/L (ref 3.5–5.1)
Sodium: 138 mmol/L (ref 135–145)

## 2022-11-19 LAB — CBC WITH DIFFERENTIAL/PLATELET
Abs Immature Granulocytes: 0.01 10*3/uL (ref 0.00–0.07)
Basophils Absolute: 0 10*3/uL (ref 0.0–0.1)
Basophils Relative: 0 %
Eosinophils Absolute: 0.1 10*3/uL (ref 0.0–0.5)
Eosinophils Relative: 1 %
HCT: 37.6 % — ABNORMAL LOW (ref 39.0–52.0)
Hemoglobin: 13.2 g/dL (ref 13.0–17.0)
Immature Granulocytes: 0 %
Lymphocytes Relative: 30 %
Lymphs Abs: 1.8 10*3/uL (ref 0.7–4.0)
MCH: 31.7 pg (ref 26.0–34.0)
MCHC: 35.1 g/dL (ref 30.0–36.0)
MCV: 90.4 fL (ref 80.0–100.0)
Monocytes Absolute: 0.5 10*3/uL (ref 0.1–1.0)
Monocytes Relative: 9 %
Neutro Abs: 3.5 10*3/uL (ref 1.7–7.7)
Neutrophils Relative %: 60 %
Platelets: 255 10*3/uL (ref 150–400)
RBC: 4.16 MIL/uL — ABNORMAL LOW (ref 4.22–5.81)
RDW: 13.2 % (ref 11.5–15.5)
WBC: 5.8 10*3/uL (ref 4.0–10.5)
nRBC: 0 % (ref 0.0–0.2)

## 2022-11-19 LAB — GLUCOSE, CAPILLARY
Glucose-Capillary: 104 mg/dL — ABNORMAL HIGH (ref 70–99)
Glucose-Capillary: 136 mg/dL — ABNORMAL HIGH (ref 70–99)
Glucose-Capillary: 195 mg/dL — ABNORMAL HIGH (ref 70–99)
Glucose-Capillary: 87 mg/dL (ref 70–99)

## 2022-11-19 LAB — MAGNESIUM: Magnesium: 2 mg/dL (ref 1.7–2.4)

## 2022-11-19 MED ORDER — INSULIN GLARGINE 100 UNIT/ML ~~LOC~~ SOLN: 15.0000 [IU] | Freq: Every day | SUBCUTANEOUS | Status: AC

## 2022-11-19 MED ORDER — LEVETIRACETAM 750 MG PO TABS: 750.0000 mg | ORAL_TABLET | Freq: Two times a day (BID) | ORAL | 0 refills | Status: AC

## 2022-11-19 NOTE — Evaluation (Signed)
Physical Therapy Evaluation Patient Details Name: Alex Franco MRN: [id number] DOB: 1941-04-19 Today's Date: 11/19/2022  History of Present Illness  Pt is a 82 y.o. M who presents 11/17/2022 with right sided weakness and difficulty speaking. CT head without contrast showed mixed density 1.6 cm thick left cerebral convexity subdural hematoma with approximately 6 mm rightward shift. He was started on IV Keppra for concern for seizures and Cleviprex drip. Significant PMH: recent SDH with burr hole drainage, HTN, HLD, DM2, CKD III.  Clinical Impression  PTA, pt lives with his spouse, uses a RW for household ambulation and Rollator for community ambulation, and requires assist for ADL's. Pt appears to be closer to his functional baseline since the recent SDH. Presents with mild RUE weakness, functional RLE weakness, gait abnormalities, impaired standing balance and decreased activity tolerance. Pt ambulating 200 ft with a Rollator at a min guard assist level. Pt spouse notes improved R foot clearance compared to symptom onset directly prior to admission. Pt would benefit from continued HHPT to address deficits and maximize functional independence.     Recommendations for follow up therapy are one component of a multi-disciplinary discharge planning process, led by the attending physician.  Recommendations may be updated based on patient status, additional functional criteria and insurance authorization.  Follow Up Recommendations Home health PT      Assistance Recommended at Discharge Frequent or constant Supervision/Assistance  Patient can return home with the following  A little help with walking and/or transfers;A little help with bathing/dressing/bathroom;Assistance with cooking/housework;Direct supervision/assist for medications management;Direct supervision/assist for financial management;Assist for transportation;Help with stairs or ramp for entrance    Equipment Recommendations None recommended  by PT  Recommendations for Other Services       Functional Status Assessment Patient has had a recent decline in their functional status and demonstrates the ability to make significant improvements in function in a reasonable and predictable amount of time.     Precautions / Restrictions Precautions Precautions: Fall Restrictions Weight Bearing Restrictions: No      Mobility  Bed Mobility               General bed mobility comments: Sitting EOB upon arrival    Transfers Overall transfer level: Needs assistance Equipment used: Rollator (4 wheels) Transfers: Sit to/from Stand Sit to Stand: Supervision           General transfer comment: Slower to rise, no physical assist required, cues for locking Rollator prior to transition    Ambulation/Gait Ambulation/Gait assistance: Min guard Gait Distance (Feet): 200 Feet Assistive device: Rollator (4 wheels) Gait Pattern/deviations: Step-through pattern, Decreased step length - right, Decreased stance time - right, Decreased dorsiflexion - right, Trendelenburg Gait velocity: decreased Gait velocity interpretation: <1.8 ft/sec, indicate of risk for recurrent falls   General Gait Details: Mildly decreased R step length, decreased heel strike at initial contact, increased Trendelenberg gait wtih distance. Min guard overall for safety. No R knee instability noted  Stairs            Wheelchair Mobility    Modified Rankin (Stroke Patients Only) Modified Rankin (Stroke Patients Only) Pre-Morbid Rankin Score: Moderately severe disability Modified Rankin: Moderately severe disability     Balance Overall balance assessment: Needs assistance Sitting-balance support: Feet supported Sitting balance-Leahy Scale: Good     Standing balance support: Bilateral upper extremity supported Standing balance-Leahy Scale: Poor Standing balance comment: reliant on BUE support  Pertinent  Vitals/Pain Pain Assessment Pain Assessment: No/denies pain    Home Living Family/patient expects to be discharged to:: Private residence Living Arrangements: Spouse/significant other Available Help at Discharge: Family Type of Home: House Home Access: Level entry       Home Layout: One level Home Equipment: Conservation officer, nature (2 wheels);Rollator (4 wheels);Shower seat;BSC/3in1      Prior Function Prior Level of Function : Needs assist             Mobility Comments: uses RW for household ambulation, Rollator for community ambulation. Receiving HHPT 3x/wk ADLs Comments: pt spouse assists with LB and back bathing, LB dressing, set up for UB dressing. Set up for medications. Assist for all IADL's. Uses urinal at night     Hand Dominance   Dominant Hand: Left    Extremity/Trunk Assessment   Upper Extremity Assessment Upper Extremity Assessment: Defer to OT evaluation    Lower Extremity Assessment Lower Extremity Assessment: RLE deficits/detail;LLE deficits/detail RLE Deficits / Details: Strength 5/5 (hip flexion within limited range) LLE Deficits / Details: Strength 5/5    Cervical / Trunk Assessment Cervical / Trunk Assessment: Other exceptions Cervical / Trunk Exceptions: mildly forward head posture  Communication   Communication: Expressive difficulties (mildly slurred speech)  Cognition Arousal/Alertness: Awake/alert Behavior During Therapy: WFL for tasks assessed/performed Overall Cognitive Status: History of cognitive impairments - at baseline                                 General Comments: Hx recent SDH. Pt reports slurred and slow cadence of speech with slower processing, pt wife reports it is "his way or highway," with some decreased insight into safety/deficits at home        General Comments      Exercises     Assessment/Plan    PT Assessment Patient needs continued PT services  PT Problem List Decreased strength;Decreased activity  tolerance;Decreased balance;Decreased mobility;Decreased cognition;Decreased safety awareness       PT Treatment Interventions DME instruction;Gait training;Functional mobility training;Therapeutic activities;Therapeutic exercise;Balance training;Patient/family education    PT Goals (Current goals can be found in the Care Plan section)  Acute Rehab PT Goals Patient Stated Goal: to go home PT Goal Formulation: With patient/family Time For Goal Achievement: 12/03/22 Potential to Achieve Goals: Good    Frequency Min 3X/week     Co-evaluation PT/OT/SLP Co-Evaluation/Treatment: Yes Reason for Co-Treatment: Other (comment);To address functional/ADL transfers (MD asked to see for d/c)           AM-PAC PT "6 Clicks" Mobility  Outcome Measure Help needed turning from your back to your side while in a flat bed without using bedrails?: None Help needed moving from lying on your back to sitting on the side of a flat bed without using bedrails?: A Little Help needed moving to and from a bed to a chair (including a wheelchair)?: A Little Help needed standing up from a chair using your arms (e.g., wheelchair or bedside chair)?: A Little Help needed to walk in hospital room?: A Little Help needed climbing 3-5 steps with a railing? : A Little 6 Click Score: 19    End of Session Equipment Utilized During Treatment: Gait belt Activity Tolerance: Patient tolerated treatment well Patient left: in bed;with call bell/phone within reach;with family/visitor present   PT Visit Diagnosis: Unsteadiness on feet (R26.81);Other abnormalities of gait and mobility (R26.89)    Time: [id number] PT Time Calculation (min) (ACUTE  ONLY): 30 min   Charges:   PT Evaluation $PT Eval Low Complexity: [place], PT, DPT Acute Rehabilitation Services Office [phone]   Deno Etienne 11/19/2022, 8:39 AM

## 2022-11-19 NOTE — Evaluation (Signed)
Occupational Therapy Evaluation Patient Details Name: Alex Franco MRN: [id number] DOB: 1941/08/25 Today's Date: 11/19/2022   History of Present Illness Pt is a 82 y.o. M who presents 11/17/2022 with right sided weakness and difficulty speaking. CT head without contrast showed mixed density 1.6 cm thick left cerebral convexity subdural hematoma with approximately 6 mm rightward shift. He was started on IV Keppra for concern for seizures and Cleviprex drip. Significant PMH: recent SDH with burr hole drainage, HTN, HLD, DM2, CKD III.   Clinical Impression   Pt reports needing assist at baseline for ADLs/IADLs and uses RW and rollator for mobility, lives with spouse who provides assist. Pt needing min guard-mod A for ADLs, and supervision for transfers with rollator. Pt with RUE weakness and decreased cognition/safety awareness, needing cues to lock brakes during transitional movement. Pt presenting with impairments listed below, will follow acutely. Recommend HHOT at d/c.     Recommendations for follow up therapy are one component of a multi-disciplinary discharge planning process, led by the attending physician.  Recommendations may be updated based on patient status, additional functional criteria and insurance authorization.   Follow Up Recommendations  Home health OT     Assistance Recommended at Discharge Frequent or constant Supervision/Assistance  Patient can return home with the following A lot of help with walking and/or transfers;A lot of help with bathing/dressing/bathroom;Assistance with cooking/housework;Direct supervision/assist for financial management;Direct supervision/assist for medications management;Assist for transportation;Help with stairs or ramp for entrance    Functional Status Assessment  Patient has had a recent decline in their functional status and demonstrates the ability to make significant improvements in function in a reasonable and predictable amount of time.   Equipment Recommendations  None recommended by OT (pt has all needed DME)    Recommendations for Other Services PT consult     Precautions / Restrictions Precautions Precautions: Fall Restrictions Weight Bearing Restrictions: No      Mobility Bed Mobility               General bed mobility comments: Sitting EOB upon arrival and departure    Transfers Overall transfer level: Needs assistance Equipment used: Rollator (4 wheels) Transfers: Sit to/from Stand Sit to Stand: Supervision           General transfer comment: Slower to rise, no physical assist required, cues for locking Rollator prior to transition      Balance Overall balance assessment: Needs assistance Sitting-balance support: Feet supported Sitting balance-Leahy Scale: Good     Standing balance support: Bilateral upper extremity supported Standing balance-Leahy Scale: Poor Standing balance comment: reliant on BUE support                           ADL either performed or assessed with clinical judgement   ADL Overall ADL's : Needs assistance/impaired Eating/Feeding: Set up   Grooming: Set up   Upper Body Bathing: Minimal assistance   Lower Body Bathing: Moderate assistance   Upper Body Dressing : Minimal assistance   Lower Body Dressing: Moderate assistance   Toilet Transfer: Engineer, manufacturing (4 wheels)   Toileting- Water quality scientist and Hygiene: Min guard       Functional mobility during ADLs: Engineer, manufacturing (4 wheels)       Vision Baseline Vision/History: 1 Wears glasses (reading only) Patient Visual Report: No change from baseline Vision Assessment?: No apparent visual deficits     Perception Perception Perception Tested?: No   Praxis Praxis Praxis tested?: Not  tested    Pertinent Vitals/Pain Pain Assessment Pain Assessment: No/denies pain     Hand Dominance Left   Extremity/Trunk Assessment Upper Extremity Assessment Upper Extremity  Assessment: Generalized weakness (mild R weakness compared to L, pt L hand dominant, reports weak RUE from prior SDH however is weaker than baseline per pt/spouse)   Lower Extremity Assessment Lower Extremity Assessment: Defer to PT evaluation RLE Deficits / Details: Strength 5/5 (hip flexion within limited range) LLE Deficits / Details: Strength 5/5   Cervical / Trunk Assessment Cervical / Trunk Assessment: Other exceptions Cervical / Trunk Exceptions: mildly forward head posture   Communication Communication Communication: Expressive difficulties (mild slurred speech)   Cognition Arousal/Alertness: Awake/alert Behavior During Therapy: WFL for tasks assessed/performed Overall Cognitive Status: Impaired/Different from baseline Area of Impairment: Safety/judgement, Awareness, Following commands                       Following Commands: Follows one step commands with increased time Safety/Judgement: Decreased awareness of safety, Decreased awareness of deficits (needs cues to lock brakes on rollator when sitting/standing) Awareness: Emergent   General Comments: Hx recent SDH. Pt reports slurred and slow cadence of speech with slower processing, pt wife reports it is "his way or highway," with some decreased insight into safety/deficits at home     General Comments  VSS on RA; spouse present and supportive    Exercises     Shoulder Instructions      Home Living Family/patient expects to be discharged to:: Private residence Living Arrangements: Spouse/significant other Available Help at Discharge: Family Type of Home: House Home Access: Level entry     [place]: One level     Bathroom Shower/Tub: [place]: Conservation officer, nature (2 wheels);Rollator (4 wheels);Shower seat;BSC/3in1          Prior Functioning/Environment Prior Level of Function : Needs assist             Mobility Comments: uses RW for household ambulation,  Rollator for community ambulation. Receiving HHPT 3x/wk. Per spouse, pt frequently furniture walks, falls weekly ADLs Comments: pt spouse assists with LB and back bathing, LB dressing, set up for UB dressing. Set up for medications. Assist for all IADL's. Uses urinal at night        OT Problem List: Decreased strength;Decreased range of motion;Decreased activity tolerance;Impaired balance (sitting and/or standing);Decreased cognition;Decreased safety awareness      OT Treatment/Interventions: Self-care/ADL training;Therapeutic exercise;Energy conservation;DME and/or AE instruction;Therapeutic activities;Patient/family education;Balance training    OT Goals(Current goals can be found in the care plan section) Acute Rehab OT Goals Patient Stated Goal: none stated OT Goal Formulation: With patient Time For Goal Achievement: 12/03/22 Potential to Achieve Goals: Good ADL Goals Pt Will Perform Upper Body Dressing: with min guard assist;sitting Pt Will Perform Lower Body Dressing: with min assist;sit to/from stand;sitting/lateral leans Pt Will Transfer to Toilet: with min assist;ambulating;regular height toilet Pt Will Perform Tub/Shower Transfer: Shower transfer;with min assist;ambulating;shower seat  OT Frequency: Min 2X/week    Co-evaluation PT/OT/SLP Co-Evaluation/Treatment: Yes Reason for Co-Treatment: Other (comment) (MD asked to see for d/c)          AM-PAC OT "6 Clicks" Daily Activity     Outcome Measure Help from another person eating meals?: None Help from another person taking care of personal grooming?: A Little Help from another person toileting, which includes using toliet, bedpan, or urinal?: A Little Help from another person bathing (  including washing, rinsing, drying)?: A Lot Help from another person to put on and taking off regular upper body clothing?: A Little Help from another person to put on and taking off regular lower body clothing?: A Lot 6 Click Score: 17    End of Session Equipment Utilized During Treatment: Gait belt;Rollator (4 wheels) Nurse Communication: Mobility status  Activity Tolerance: Patient tolerated treatment well Patient left: in bed;with call bell/phone within reach;with bed alarm set;with family/visitor present;with nursing/sitter in room  OT Visit Diagnosis: Unsteadiness on feet (R26.81);Other abnormalities of gait and mobility (R26.89);Muscle weakness (generalized) (M62.81);History of falling (Z91.81);Cognitive communication deficit (R41.841)                [id number] OT Time Calculation (min): 25 min Charges:  OT General Charges $OT Visit: 1 Visit OT Evaluation $OT Eval Moderate Complexity: 1 Mod  Kacey Vicuna K, OTD, OTR/L SecureChat Preferred Acute Rehab [phone]   Renaye Rakers Koonce 11/19/2022, 9:14 AM

## 2022-11-19 NOTE — Progress Notes (Signed)
   Palliative Medicine Inpatient Follow Up Note  HPI:  82 year old male with past medical history for recent SDH treated at the [place] with bur hole drainage, hypertension, hyperlipidemia, diabetes mellitus type 2, CKD stage IIIa presented with right-sided weakness and difficulty speaking.  On presentation, speech was garbled and difficult to understand with right-sided weakness.  Code stroke was called.  CT head without contrast showed mixed density 1.6 cm thick left cerebral convexity subdural hematoma with approximately 6 mm rightward shift.  Neurology and neurosurgery were consulted.  He was started on IV Keppra for concern for seizures along with Cleviprex drip.  Neurosurgery recommended conservative management.  EEG was ordered.    Palliative care has been asked to get involved to further address goals of care.  Today's Discussion 11/19/2022  *Please note that this is a verbal dictation therefore any spelling or grammatical errors are due to the "[place] [employer]" system interpretation.  Chart reviewed inclusive of vital signs, progress notes, laboratory results, and diagnostic images.   I met with Alex Franco and his significant other, [name] this morning. We discussed that he had a good night but what's to get out of the hospital today. We discussed that this will be dependent on the hospitalist services though it does appear that he has stabilized. Reviewed the plan for PT/OT evaluation(s).  Patient continues to be clear with this goals of treating what is treatable. He is without great symptom burden at this time.  Eating and drinking well.   Reflected on patients life and how he feels well supported by [place].   Questions and concerns addressed/Palliative Support Provided.   Objective Assessment: Vital Signs Vitals:   11/19/22 0354 11/19/22 0830  BP: (!) 117/54 (!) 140/67  Pulse: 77 83  Resp: 14 18  Temp: 99.2 F (37.3 C) 97.8 F (36.6 C)  SpO2: 97% 98%    Intake/Output Summary  (Last 24 hours) at 11/19/2022 [id number] Last data filed at 11/19/2022 [id number] Gross per 24 hour  Intake 553.48 ml  Output 775 ml  Net -221.52 ml   Last Weight  Most recent update: 11/17/2022  4:25 PM    Weight  97.6 kg (215 lb 2.7 oz)            Gen: Elderly African-American male in no acute distress HEENT: moist mucous membranes CV: Regular rate and rhythm PULM: On room air breathing is even and nonlabored ABD: soft/nontender EXT: No edema Neuro: Alert and oriented x3, (+) dysarthria  SUMMARY OF RECOMMENDATIONS   DNAR/DNI   HCPOA documents obtained and will be scanned into [employer]   Goals are clear for improvement  Plan for discharge once medially optimized   Billing based on MDM: High ______________________________________________________________________________________ [place] Team Team Cell Phone: [phone] Please utilize secure chat with additional questions, if there is no response within 30 minutes please call the above phone number  Palliative Medicine Team providers are available by phone from 7am to 7pm daily and can be reached through the team cell phone.  Should this patient require assistance outside of these hours, please call the patient's attending physician.

## 2022-11-19 NOTE — Discharge Summary (Signed)
Physician Discharge Summary  Alex Franco [id number] DOB: 1941-06-21 DOA: 11/17/2022  PCP: [facility]  Admit date: 11/17/2022 Discharge date: 11/19/2022  Admitted From: Home Disposition: Home  Recommendations for Outpatient Follow-up:  Follow up with PCP in 1 week with repeat CBC/BMP Outpatient follow-up with neurology Follow up in ED if symptoms worsen or new appear   Home Health: No Equipment/Devices: None  Discharge Condition: Stable CODE STATUS: DNR  diet recommendation: Heart healthy/carb modified  Brief/Interim Summary: 82 year old male with past medical history for recent SDH treated at the [place] with bur hole drainage, hypertension, hyperlipidemia, diabetes mellitus type 2, CKD stage IIIa presented with right-sided weakness and difficulty speaking.  On presentation, speech was garbled and difficult to understand with right-sided weakness.  Code stroke was called.  CT head without contrast showed mixed density 1.6 cm thick left cerebral convexity subdural hematoma with approximately 6 mm rightward shift.  Neurology and neurosurgery were consulted.  He was started on IV Keppra for concern for seizures along with Cleviprex drip.  Neurosurgery recommended conservative management.  EEG was ordered.  During the hospitalization, his condition has improved.  EEG was negative for seizures.  Neurology recommended to continue Keppra 750 mg twice a day and outpatient follow-up with neurology and cleared the patient for discharge.  PT/OT recommended home health PT/OT.  Mental status has much improved.  He feels okay to go home today.  He will be discharged home today with outpatient follow-up with PCP and neurology.  Discharge Diagnoses:   Seizures History of recent SDH with bur hole treatment -Was supposed to start possibly Keppra at home if he would have seizure.  Presented with possible seizure.  Currently on IV Keppra as per neurology recommendations.  -Neurosurgery  recommended conservative management and outpatient follow-up with [facility] neurosurgery.  Patient was empirically started on Cleviprex drip which was subsequently discontinued. -During the hospitalization, his condition has improved.  EEG was negative for seizures.  Neurology recommended to continue Keppra 750 mg twice a day and outpatient follow-up with neurology and cleared the patient for discharge.  PT/OT recommended home health PT/OT.  Mental status has much improved.  He feels okay to go home today.  He will be discharged home today with outpatient follow-up with PCP and neurology.  Diabetes mellitus type 2 -A1c 6.6.  Continue carb modified diet and home insulin regimen.   Hypertension -Blood pressure intermittently on the lower side.  Resume home regimen except for chlorthalidone.  Outpatient follow-up with PCP.    Hyperlipidemia -Continue statin.  CKD stage IIIa -Creatinine currently stable.   Hypomagnesemia -Improved.   Obesity -Outpatient follow-up   Physical deconditioning -PT/OT recommended home health PT/OT.  Goals of care -Palliative care evaluation appreciated.  CODE STATUS has been changed to DNR. Discharge Instructions  Discharge Instructions     Diet - low sodium heart healthy   Complete by: As directed    Increase activity slowly   Complete by: As directed       Allergies as of 11/19/2022   No Known Allergies      Medication List     STOP taking these medications    chlorthalidone 25 MG tablet Commonly known as: HYGROTON   insulin glargine-yfgn 100 UNIT/ML injection Commonly known as: SEMGLEE   multivitamin with minerals Tabs tablet       TAKE these medications    acetaminophen 650 MG CR tablet Commonly known as: TYLENOL Take 650 mg by mouth daily.   Alogliptin Benzoate 6.25  MG Tabs Take 6.25 mg by mouth every other day.   amLODipine 10 MG tablet Commonly known as: NORVASC Take 10 mg by mouth daily.   atorvastatin 40 MG  tablet Commonly known as: LIPITOR Take 20 mg by mouth daily.   insulin glargine 100 UNIT/ML injection Commonly known as: LANTUS Inject 0.15 mLs (15 Units total) into the skin at bedtime.   levETIRAcetam 750 MG tablet Commonly known as: Keppra Take 1 tablet (750 mg total) by mouth 2 (two) times daily.   losartan 50 MG tablet Commonly known as: COZAAR Take 50 mg by mouth daily.   tamsulosin 0.4 MG Caps capsule Commonly known as: FLOMAX Take 0.4 mg by mouth at bedtime.   Vitamin D3 25 MCG (1000 UT) capsule Generic drug: Cholecalciferol Take 1,000 Units by mouth daily.        Follow-up [facility]. Schedule an appointment as soon as possible for a visit in 1 week(s).   Specialty: General Practice Why: with repeat bmp Contact information: [address] [place] [phone]         Neurologist. Schedule an appointment as soon as possible for a visit in 1 week(s).                 No Known Allergies  Consultations: Palliative care/neurology/neurosurgery   Procedures/Studies: Overnight EEG with video  Result Date: 11/18/2022 Lora Havens, MD     11/19/2022  9:39 AM Patient Name: Alex Franco Epilepsy Attending: Lora Havens Referring Physician/Provider: Kerney Elbe, MD Duration: 11/18/2022 [id number] to 11/18/2022 1256  Patient history: 82 y.o. male presenting with a possible worsening of recent traumatic subdural hematoma s/p bur hole done through the [place] presenting wth seizure. EEG to evaluate for seizure.  Level of alertness: Awake, asleep  AEDs during EEG study: LEV  Technical aspects: This EEG study was done with scalp electrodes positioned according to the 10-20 International system of electrode placement. Electrical activity was reviewed with band pass filter of 1-'70Hz'$ , sensitivity of 7 uV/mm, display speed of 72m/sec with a '60Hz'$  notched filter applied as appropriate. EEG data were recorded continuously and  digitally stored.  Video monitoring was available and reviewed as appropriate.  Description: The posterior dominant rhythm consists of 8 Hz activity of moderate voltage (25-35 uV) seen predominantly in posterior head regions, symmetric and reactive to eye opening and eye closing. Sleep was characterized by vertex waves, sleep spindles (12 to 14 Hz), maximal frontocentral region. EEG showed intermittent 3 to 5 Hz theta-delta slowing in left fronto-temporal region. Hyperventilation and photic stimulation were not performed.    ABNORMALITY - Intermittent slow, left fronto-temporal region  IMPRESSION: This study is suggestive of cortical dysfunction arising from left fronto-temporal region likely secondary to underlying SDH. No seizures or epileptiform discharges were seen throughout the recording.  Please note that lack of epileptiform activity during  interictal EEG does not exclude  the diagnosis of epilepsy.  PLora Havens   EEG adult  Result Date: 11/17/2022 YLora Havens MD     11/18/2022  9:45 AM Patient Name: Alex ALBUMRN: [id number] Attending: PLora HavensReferring Physician/Provider: BLorenza Chick MD Date: 11/17/2022 Duration: 25.23 mins Patient history: 82y.o. male presenting with a possible worsening of recent traumatic subdural hematoma [place] [facility] [place] [place] EEG to evaluate for seizure. Level of alertness: Awake, asleep AEDs during EEG study: LEV Technical  aspects: This EEG study was done with scalp electrodes positioned according to the 10-20 International system of electrode placement. Electrical activity was reviewed with band pass filter of 1-'70Hz'$ , sensitivity of 7 uV/mm, display speed of 23m/sec with a '60Hz'$  notched filter applied as appropriate. EEG data were recorded continuously and digitally stored.  Video monitoring was available and reviewed as appropriate. Description: The posterior dominant rhythm consists of 8 Hz activity of  moderate voltage (25-35 uV) seen predominantly in posterior head regions, symmetric and reactive to eye opening and eye closing. Sleep was characterized by vertex waves, sleep spindles (12 to 14 Hz), maximal frontocentral region. EEG showed intermittent 3 to 5 Hz theta-delta slowing in left fronto-temporal region. Hyperventilation and photic stimulation were not performed.   ABNORMALITY - Intermittent slow, left fronto-temporal region IMPRESSION: This study is suggestive of cortical dysfunction arising from left fronto-temporal region likely secondary to underlying SDH. No seizures or epileptiform discharges were seen throughout the recording. Please note that lack of epileptiform activity during  interictal EEG does not exclude  the diagnosis of epilepsy. PLora Havens  CT HEAD CODE STROKE WO CONTRAST  Result Date: 11/17/2022 CLINICAL DATA:  Code stroke.  Neuro deficit, acute, stroke suspected EXAM: CT HEAD WITHOUT CONTRAST TECHNIQUE: Contiguous axial images were obtained from the base of the skull through the vertex without intravenous contrast. RADIATION DOSE REDUCTION: This exam was performed according to the departmental dose-optimization program which includes automated exposure control, adjustment of the mA and/or kV according to patient size and/or use of iterative reconstruction technique. COMPARISON:  None Available. FINDINGS: Brain: Mixed density 1.6 cm thick left cerebral convexity subdural hematoma. Areas of hyperdensity within this hematoma are compatible with acute/recent hemorrhage. Approximately 6 mm of rightward shift. No evidence of acute large vascular territory infarct. Patchy white matter hypodensities, nonspecific but compatible with chronic microvascular ischemic disease. No evidence of mass lesion or hydrocephalus. Partially effaced left lateral ventricle. Vascular: No hyperdense vessel identified. Skull: Left-sided burr holes.  No acute fracture. Sinuses/Orbits: Remote appearing  left medial orbital wall fracture. Clear sinuses. Other: No mastoid effusions. IMPRESSION: Mixed density 1.6 cm thick left cerebral convexity subdural hematoma. Areas of hyperdensity within this hematoma are compatible with acute/recent hemorrhage. Approximately 6 mm of rightward shift. Code stroke imaging results were communicated on 11/17/2022 at 4:30 pm to provider Bhagat via telephone, who verbally acknowledged these results. Electronically Signed   By: FMargaretha SheffieldM.D.   On: 11/17/2022 16:34      Subjective: Patient seen and examined at bedside.  Awake and still intermittently smiling.  Wife at bedside.  Patient feels okay to go home.  Denies any current weakness, nausea, vomiting.  No seizures reported since admission. Discharge Exam: Vitals:   11/19/22 0354 11/19/22 0830  BP: (!) 117/54 (!) 140/67  Pulse: 77 83  Resp: 14 18  Temp: 99.2 F (37.3 C) 97.8 F (36.6 C)  SpO2: 97% 98%    General: Pt is alert, awake, not in acute distress.  Elderly male lying in bed.  On room air.  Intermittently smiling and pleasant. Cardiovascular: rate controlled, S1/S2 + Respiratory: bilateral decreased breath sounds at bases Abdominal: Soft, obese, NT, ND, bowel sounds + Extremities: no edema, no cyanosis    The results of significant diagnostics from this hospitalization (including imaging, microbiology, ancillary and laboratory) are listed below for reference.     Microbiology: No results found for this or any previous visit (from the past 240 hour(s)).   Labs: BNP (last  3 results) No results for input(s): "BNP" in the last 8760 hours. Basic Metabolic Panel: Recent Labs  Lab 11/17/22 1625 11/17/22 1630 11/18/22 0545 11/19/22 0454  NA 137 136 137 138  K 4.2 4.4 3.9 3.9  CL 103 108 103 104  CO2 21*  --  25 24  GLUCOSE 106* 103* 105* 99  BUN 21 24* 19 17  CREATININE 1.68* 1.80* 1.51* 1.36*  CALCIUM 9.8  --  9.3 9.4  MG  --   --  1.6* 2.0   Liver Function Tests: Recent  Labs  Lab 11/17/22 1625  AST 19  ALT 15  ALKPHOS 107  BILITOT 0.9  PROT 7.1  ALBUMIN 3.9   No results for input(s): "LIPASE", "AMYLASE" in the last 168 hours. No results for input(s): "AMMONIA" in the last 168 hours. CBC: Recent Labs  Lab 11/17/22 1625 11/17/22 1630 11/18/22 0545 11/19/22 0454  WBC 7.2  --  6.6 5.8  NEUTROABS 4.1  --   --  3.5  HGB 13.6 14.3 12.8* 13.2  HCT 40.0 42.0 36.2* 37.6*  MCV 91.1  --  89.6 90.4  PLT 255  --  251 255   Cardiac Enzymes: No results for input(s): "CKTOTAL", "CKMB", "CKMBINDEX", "TROPONINI" in the last 168 hours. BNP: Invalid input(s): "POCBNP" CBG: Recent Labs  Lab 11/18/22 1647 11/18/22 2005 11/19/22 0031 11/19/22 0357 11/19/22 0827  GLUCAP 95 209* 87 104* 195*   D-Dimer No results for input(s): "DDIMER" in the last 72 hours. Hgb A1c Recent Labs    11/18/22 0545  HGBA1C 6.6*   Lipid Profile No results for input(s): "CHOL", "HDL", "LDLCALC", "TRIG", "CHOLHDL", "LDLDIRECT" in the last 72 hours. Thyroid function studies No results for input(s): "TSH", "T4TOTAL", "T3FREE", "THYROIDAB" in the last 72 hours.  Invalid input(s): "FREET3" Anemia work up No results for input(s): "VITAMINB12", "FOLATE", "FERRITIN", "TIBC", "IRON", "RETICCTPCT" in the last 72 hours. Urinalysis    Component Value Date/Time   COLORURINE YELLOW 11/17/2022 1618   APPEARANCEUR CLEAR 11/17/2022 1618   LABSPEC 1.015 11/17/2022 1618   PHURINE 6.0 11/17/2022 1618   GLUCOSEU NEGATIVE 11/17/2022 1618   HGBUR NEGATIVE 11/17/2022 1618   BILIRUBINUR NEGATIVE 11/17/2022 1618   KETONESUR NEGATIVE 11/17/2022 1618   PROTEINUR NEGATIVE 11/17/2022 1618   UROBILINOGEN 0.2 04/25/2014 1828   NITRITE NEGATIVE 11/17/2022 1618   LEUKOCYTESUR NEGATIVE 11/17/2022 1618   Sepsis Labs Recent Labs  Lab 11/17/22 1625 11/18/22 0545 11/19/22 0454  WBC 7.2 6.6 5.8   Microbiology No results found for this or any previous visit (from the past 240  hour(s)).   Time coordinating discharge: 35 minutes  SIGNED:   Aline August, MD  [facility] 11/19/2022, 9:53 AM

## 2022-11-19 NOTE — TOC Transition Note (Signed)
Transition of Care [facility]) - CM/SW Discharge Note   Patient Details  Name: Alex Franco MRN: [id number] Date of Birth: 04-18-1941  Transition of Care [facility]) CM/SW Contact:  Carles Collet, RN Phone Number: 11/19/2022, 10:16 AM   Clinical Narrative:     Patient active w [facility] for home health services prior to admission. Liaison notified and will schedule a visit for tomorrow at home  No DME needs, no other TOC needs for DC   Final next level of care: Home w Home Health Services Barriers to Discharge: No Barriers Identified   Patient Goals and CMS Choice      Discharge Placement                         Discharge Plan and Services Additional resources added to the After Visit Summary for                  DME Arranged: N/A           HH Agency: [place] Date [place]: 11/19/22 Time [place]: [place] Representative spoke with at [place]: Amy  Social Determinants of Health ([place]) Interventions SDOH Screenings   Tobacco Use: Low Risk  (11/18/2022)     Readmission Risk Interventions     No data to display
